# Patient Record
Sex: Male | Born: 1975
Health system: Southern US, Community
[De-identification: ages and names within clinical notes are randomized; demographics above are authoritative.]

## PROBLEM LIST (undated history)

## (undated) DIAGNOSIS — J449 Chronic obstructive pulmonary disease, unspecified: Secondary | ICD-10-CM

## (undated) DIAGNOSIS — E785 Hyperlipidemia, unspecified: Secondary | ICD-10-CM

## (undated) DIAGNOSIS — J9819 Other pulmonary collapse: Secondary | ICD-10-CM

## (undated) HISTORY — PX: SPINE SURGERY: SHX786

## (undated) HISTORY — PX: VASECTOMY: SHX75

---

## 2000-11-04 ENCOUNTER — Emergency Department (HOSPITAL_COMMUNITY): Admission: EM | Admit: 2000-11-04 | Discharge: 2000-11-04 | Payer: Self-pay | Admitting: Emergency Medicine

## 2005-03-15 ENCOUNTER — Inpatient Hospital Stay (HOSPITAL_COMMUNITY): Admission: EM | Admit: 2005-03-15 | Discharge: 2005-03-17 | Payer: Self-pay | Admitting: Emergency Medicine

## 2005-03-22 ENCOUNTER — Ambulatory Visit (HOSPITAL_COMMUNITY): Admission: RE | Admit: 2005-03-22 | Discharge: 2005-03-22 | Payer: Self-pay | Admitting: General Surgery

## 2007-05-08 ENCOUNTER — Ambulatory Visit (HOSPITAL_COMMUNITY): Admission: RE | Admit: 2007-05-08 | Discharge: 2007-05-08 | Payer: Self-pay | Admitting: Family Medicine

## 2007-05-14 ENCOUNTER — Ambulatory Visit (HOSPITAL_COMMUNITY): Admission: RE | Admit: 2007-05-14 | Discharge: 2007-05-14 | Payer: Self-pay | Admitting: Family Medicine

## 2007-05-14 ENCOUNTER — Encounter: Payer: Self-pay | Admitting: Orthopedic Surgery

## 2007-06-04 ENCOUNTER — Ambulatory Visit: Payer: Self-pay | Admitting: Orthopedic Surgery

## 2007-06-04 ENCOUNTER — Encounter (INDEPENDENT_AMBULATORY_CARE_PROVIDER_SITE_OTHER): Payer: Self-pay | Admitting: *Deleted

## 2007-06-04 DIAGNOSIS — G5690 Unspecified mononeuropathy of unspecified upper limb: Secondary | ICD-10-CM | POA: Insufficient documentation

## 2007-06-17 ENCOUNTER — Encounter: Payer: Self-pay | Admitting: Orthopedic Surgery

## 2007-06-18 ENCOUNTER — Encounter: Payer: Self-pay | Admitting: Orthopedic Surgery

## 2007-06-19 ENCOUNTER — Encounter: Payer: Self-pay | Admitting: Orthopedic Surgery

## 2007-06-26 ENCOUNTER — Ambulatory Visit: Payer: Self-pay | Admitting: Orthopedic Surgery

## 2007-07-15 ENCOUNTER — Encounter: Payer: Self-pay | Admitting: Orthopedic Surgery

## 2007-07-17 ENCOUNTER — Ambulatory Visit (HOSPITAL_COMMUNITY): Admission: RE | Admit: 2007-07-17 | Discharge: 2007-07-17 | Payer: Self-pay | Admitting: Neurology

## 2007-10-22 ENCOUNTER — Emergency Department (HOSPITAL_COMMUNITY): Admission: EM | Admit: 2007-10-22 | Discharge: 2007-10-22 | Payer: Self-pay | Admitting: Emergency Medicine

## 2007-12-02 ENCOUNTER — Encounter: Payer: Self-pay | Admitting: Orthopedic Surgery

## 2008-03-11 ENCOUNTER — Emergency Department (HOSPITAL_COMMUNITY): Admission: EM | Admit: 2008-03-11 | Discharge: 2008-03-11 | Payer: Self-pay | Admitting: Emergency Medicine

## 2008-04-25 ENCOUNTER — Emergency Department (HOSPITAL_COMMUNITY): Admission: EM | Admit: 2008-04-25 | Discharge: 2008-04-25 | Payer: Self-pay | Admitting: Emergency Medicine

## 2008-04-30 ENCOUNTER — Encounter: Payer: Self-pay | Admitting: Orthopedic Surgery

## 2008-08-18 ENCOUNTER — Emergency Department (HOSPITAL_COMMUNITY): Admission: EM | Admit: 2008-08-18 | Discharge: 2008-08-19 | Payer: Self-pay | Admitting: Emergency Medicine

## 2008-08-19 ENCOUNTER — Ambulatory Visit (HOSPITAL_COMMUNITY): Admission: RE | Admit: 2008-08-19 | Discharge: 2008-08-19 | Payer: Self-pay | Admitting: Emergency Medicine

## 2008-10-13 ENCOUNTER — Emergency Department (HOSPITAL_COMMUNITY): Admission: EM | Admit: 2008-10-13 | Discharge: 2008-10-13 | Payer: Self-pay | Admitting: Emergency Medicine

## 2008-10-15 ENCOUNTER — Emergency Department (HOSPITAL_COMMUNITY): Admission: EM | Admit: 2008-10-15 | Discharge: 2008-10-15 | Payer: Self-pay | Admitting: Emergency Medicine

## 2008-10-20 ENCOUNTER — Ambulatory Visit (HOSPITAL_COMMUNITY): Admission: RE | Admit: 2008-10-20 | Discharge: 2008-10-20 | Payer: Self-pay | Admitting: Orthopedic Surgery

## 2008-12-08 ENCOUNTER — Ambulatory Visit: Payer: Self-pay | Admitting: Orthopedic Surgery

## 2008-12-08 DIAGNOSIS — M545 Low back pain: Secondary | ICD-10-CM | POA: Insufficient documentation

## 2008-12-08 DIAGNOSIS — M5126 Other intervertebral disc displacement, lumbar region: Secondary | ICD-10-CM

## 2008-12-08 DIAGNOSIS — M5430 Sciatica, unspecified side: Secondary | ICD-10-CM | POA: Insufficient documentation

## 2008-12-15 ENCOUNTER — Encounter (HOSPITAL_COMMUNITY): Admission: RE | Admit: 2008-12-15 | Discharge: 2008-12-29 | Payer: Self-pay | Admitting: Orthopedic Surgery

## 2008-12-15 ENCOUNTER — Encounter: Payer: Self-pay | Admitting: Orthopedic Surgery

## 2008-12-31 ENCOUNTER — Encounter (HOSPITAL_COMMUNITY): Admission: RE | Admit: 2008-12-31 | Discharge: 2009-01-30 | Payer: Self-pay | Admitting: Orthopedic Surgery

## 2009-01-12 ENCOUNTER — Emergency Department (HOSPITAL_COMMUNITY): Admission: EM | Admit: 2009-01-12 | Discharge: 2009-01-12 | Payer: Self-pay | Admitting: Emergency Medicine

## 2009-01-21 ENCOUNTER — Encounter: Payer: Self-pay | Admitting: Orthopedic Surgery

## 2009-02-02 ENCOUNTER — Encounter (INDEPENDENT_AMBULATORY_CARE_PROVIDER_SITE_OTHER): Payer: Self-pay | Admitting: *Deleted

## 2009-02-02 ENCOUNTER — Ambulatory Visit: Payer: Self-pay | Admitting: Orthopedic Surgery

## 2009-02-15 ENCOUNTER — Encounter: Payer: Self-pay | Admitting: Orthopedic Surgery

## 2009-02-22 ENCOUNTER — Encounter: Payer: Self-pay | Admitting: Orthopedic Surgery

## 2009-02-23 ENCOUNTER — Telehealth (INDEPENDENT_AMBULATORY_CARE_PROVIDER_SITE_OTHER): Payer: Self-pay | Admitting: *Deleted

## 2009-03-17 ENCOUNTER — Encounter: Payer: Self-pay | Admitting: Orthopedic Surgery

## 2009-04-05 ENCOUNTER — Encounter (HOSPITAL_COMMUNITY): Admission: RE | Admit: 2009-04-05 | Discharge: 2009-05-05 | Payer: Self-pay | Admitting: Orthopedic Surgery

## 2009-05-24 ENCOUNTER — Ambulatory Visit (HOSPITAL_COMMUNITY): Admission: RE | Admit: 2009-05-24 | Discharge: 2009-05-25 | Payer: Self-pay | Admitting: Neurosurgery

## 2009-06-03 ENCOUNTER — Emergency Department (HOSPITAL_COMMUNITY): Admission: EM | Admit: 2009-06-03 | Discharge: 2009-06-03 | Payer: Self-pay | Admitting: Emergency Medicine

## 2009-06-16 ENCOUNTER — Encounter: Payer: Self-pay | Admitting: Orthopedic Surgery

## 2009-07-01 ENCOUNTER — Encounter (HOSPITAL_COMMUNITY): Admission: RE | Admit: 2009-07-01 | Discharge: 2009-07-31 | Payer: Self-pay | Admitting: Neurosurgery

## 2009-07-10 ENCOUNTER — Emergency Department (HOSPITAL_COMMUNITY): Admission: EM | Admit: 2009-07-10 | Discharge: 2009-07-10 | Payer: Self-pay | Admitting: Emergency Medicine

## 2009-07-21 ENCOUNTER — Encounter: Payer: Self-pay | Admitting: Orthopedic Surgery

## 2009-07-22 ENCOUNTER — Emergency Department (HOSPITAL_COMMUNITY): Admission: EM | Admit: 2009-07-22 | Discharge: 2009-07-22 | Payer: Self-pay | Admitting: Emergency Medicine

## 2009-11-27 ENCOUNTER — Emergency Department (HOSPITAL_COMMUNITY): Admission: EM | Admit: 2009-11-27 | Discharge: 2009-11-28 | Payer: Self-pay | Admitting: Emergency Medicine

## 2010-02-13 ENCOUNTER — Emergency Department (HOSPITAL_COMMUNITY): Admission: EM | Admit: 2010-02-13 | Discharge: 2010-02-13 | Payer: Self-pay | Admitting: Emergency Medicine

## 2010-05-02 NOTE — Letter (Signed)
Summary: [facility] Office note Dr Anise Salvo Office note Dr Jeral Fruit   Imported By: Cammie Sickle 07/23/2009 11:27:02  _____________________________________________________________________  External Attachment:    Type:   Image     Comment:   External Document

## 2010-05-02 NOTE — Letter (Signed)
Summary: [facility] office note Dr Anise Salvo office note Dr Jeral Fruit   Imported By: Cammie Sickle 07/07/2009 19:01:09  _____________________________________________________________________  External Attachment:    Type:   Image     Comment:   External Document

## 2010-05-02 NOTE — Consult Note (Signed)
Summary: Consultation Rpt [facility] Dr Jeral Fruit  Consultation Rpt Dr Jeral Fruit   Imported By: Cammie Sickle 04/19/2009 10:45:07  _____________________________________________________________________  External Attachment:    Type:   Image     Comment:   External Document

## 2010-05-02 NOTE — Letter (Signed)
Summary: Medical record request [facility] record request [facility]   Imported By: Cammie Sickle 07/23/2009 11:37:19  _____________________________________________________________________  External Attachment:    Type:   Image     Comment:   External Document

## 2010-06-16 LAB — BASIC METABOLIC PANEL
BUN: 17 mg/dL (ref 6–23)
CO2: 24 mEq/L (ref 19–32)
Calcium: 9.4 mg/dL (ref 8.4–10.5)
Chloride: 105 mEq/L (ref 96–112)
Creatinine, Ser: 0.9 mg/dL (ref 0.4–1.5)
GFR calc Af Amer: >60 mL/min (ref >60)
GFR calc non Af Amer: >60 mL/min (ref >60)
Glucose, Bld: 98 mg/dL (ref 70–99)
Potassium: 3.8 mEq/L (ref 3.5–5.1)
Sodium: 139 mEq/L (ref 135–145)

## 2010-06-16 LAB — CBC
HCT: 46.6 % (ref 39.0–52.0)
Hemoglobin: 15.2 g/dL (ref 13.0–17.0)
MCH: 27.3 pg (ref 26.0–34.0)
MCHC: 32.7 g/dL (ref 30.0–36.0)
MCV: 83.5 fL (ref 78.0–100.0)
Platelets: 178 10*3/uL (ref 150–400)
RBC: 5.58 MIL/uL (ref 4.22–5.81)
RDW: 14.3 % (ref 11.5–15.5)
WBC: 11 10*3/uL — ABNORMAL HIGH (ref 4.0–10.5)

## 2010-06-16 LAB — POCT CARDIAC MARKERS
CKMB, poc: <1 ng/mL — ABNORMAL LOW (ref 1.0–8.0)
CKMB, poc: <1 ng/mL — ABNORMAL LOW (ref 1.0–8.0)
Myoglobin, poc: 35 ng/mL (ref 12–200)
Troponin i, poc: <0.05 ng/mL (ref 0.00–0.09)
Troponin i, poc: <0.05 ng/mL (ref 0.00–0.09)

## 2010-06-16 LAB — DIFFERENTIAL
Basophils Absolute: 0 10*3/uL (ref 0.0–0.1)
Basophils Relative: 0 % (ref 0–1)
Eosinophils Absolute: 0.2 10*3/uL (ref 0.0–0.7)
Eosinophils Relative: 2 % (ref 0–5)
Lymphocytes Relative: 19 % (ref 12–46)
Lymphs Abs: 2.1 10*3/uL (ref 0.7–4.0)
Monocytes Absolute: 0.8 10*3/uL (ref 0.1–1.0)
Monocytes Relative: 7 % (ref 3–12)
Neutro Abs: 8 10*3/uL — ABNORMAL HIGH (ref 1.7–7.7)
Neutrophils Relative %: 72 % (ref 43–77)

## 2010-06-21 LAB — RAPID URINE DRUG SCREEN, HOSP PERFORMED
Benzodiazepines: POSITIVE — AB
Cocaine: NOT DETECTED
Opiates: POSITIVE — AB
Tetrahydrocannabinol: NOT DETECTED

## 2010-06-23 LAB — CBC
HCT: 45.1 % (ref 39.0–52.0)
MCV: 83.2 fL (ref 78.0–100.0)
Platelets: 145 10*3/uL — ABNORMAL LOW (ref 150–400)
RDW: 15.3 % (ref 11.5–15.5)

## 2010-08-18 NOTE — H&P (Signed)
NAMEELSTER, CORBELLO NO.:  [id number]   MEDICAL RECORD NO.:  [id number]          PATIENT TYPE:  EMS   LOCATION:  ED                            FACILITY:  [facility]   PHYSICIAN:  Dalia Heading, M.D.  DATE OF BIRTH:  [birth year]   DATE OF ADMISSION:  03/15/2005  DATE OF DISCHARGE:  LH                                HISTORY & PHYSICAL   CHIEF COMPLAINT:  Right pneumothorax.   HISTORY OF PRESENT ILLNESS:  The patient is a 35 year old white male with  history of tobacco use who presented to the emergency room with a five to  six day history of right sided chest pain.  Chest x-ray was performed which  revealed a right pneumothorax which was approximately 50% in nature.  A  surgery consultation was obtained.   PAST MEDICAL HISTORY:  Unremarkable.   PAST SURGICAL HISTORY:  Unremarkable.   CURRENT MEDICATIONS:  None.   ALLERGIES:  PENICILLIN.   REVIEW OF SYSTEMS:  Noncontributory.   PHYSICAL EXAMINATION:  GENERAL APPEARANCE:  The patient is a well-developed,  well-nourished white male in no acute distress.  He is afebrile and vital  signs are stable.  VITAL SIGNS:  Oxygen saturation about 96%.  LUNGS:  Examination reveals decreased breath sounds in the right lung field.  HEART:  Examination reveals a regular rate and rhythm without S3, S4, or  murmurs.   DESCRIPTION OF PROCEDURE:  The patient was placed in the left lateral  decubitus position.  A small caliber chest tube was placed in the right  chest using 1% Xylocaine local anesthesia without difficulty.  It was  connected to the Thorotrast suction kit.  Initially, air was noted to  emanate through the system but this stopped.  A follow up chest x-ray is  pending.  The patient tolerated the procedure well.   IMPRESSION:  Right spontaneous pneumothorax.   PLAN:  The patient will be admitted to the hospital for management of his  chest tube.  He will approximately be in the hospital three days while his  right  lung is healing.      Dalia Heading, M.D.  Electronically Signed     MAJ/MEDQ  D:  03/15/2005  T:  03/15/2005  Job:  [id number]   cc:   Patrica Duel, M.D.  Fax: [phone]

## 2010-08-18 NOTE — Discharge Summary (Signed)
NAME:  ALONTE, WULFF NO.:  [id number]   MEDICAL RECORD NO.:  [id number]          PATIENT TYPE:  INP   LOCATION:  A323                          FACILITY:  [facility]   PHYSICIAN:  Dalia Heading, M.D.  DATE OF BIRTH:  1975/06/08   DATE OF ADMISSION:  03/15/2005  DATE OF DISCHARGE:  12/16/2006LH                                 DISCHARGE SUMMARY   HOSPITAL COURSE:  The patient is a 35 year old, white male who presented to  the emergency room with right-sided chest pain and shortness of breath.  He  was found to have a spontaneous right pneumothorax.  A chest tube was  inserted and the patient was admitted to hospital for further management of  his chest tube.  No air leak was present.  On hospital day #2, his chest  tubes placed to water seal and there was no evidence of a pneumothorax.  The  chest tube was removed and chest x-ray is pending.   The patient is being discharged home in good and improving condition.   DISCHARGE INSTRUCTIONS:  The patient is to follow up Dr. Franky Macho on  March 22, 2005 for a chest x-ray.   DISCHARGE MEDICATIONS:  None.   DISCHARGE DIAGNOSIS:  Spontaneous right pneumothorax.   PROCEDURE:  Closed thoracostomy on March 15, 2005.      Dalia Heading, M.D.  Electronically Signed     MAJ/MEDQ  D:  03/17/2005  T:  03/17/2005  Job:  [id number]   cc:   Patrica Duel, M.D.  Fax: [phone]

## 2010-10-20 ENCOUNTER — Emergency Department (HOSPITAL_COMMUNITY): Payer: Medicare Other

## 2010-10-20 ENCOUNTER — Emergency Department (HOSPITAL_COMMUNITY)
Admission: EM | Admit: 2010-10-20 | Discharge: 2010-10-20 | Disposition: A | Discharge status: Home / Self Care | Payer: Medicare Other | Attending: Emergency Medicine | Admitting: Emergency Medicine

## 2010-10-20 DIAGNOSIS — R319 Hematuria, unspecified: Secondary | ICD-10-CM | POA: Insufficient documentation

## 2010-10-20 DIAGNOSIS — M549 Dorsalgia, unspecified: Secondary | ICD-10-CM

## 2010-10-20 DIAGNOSIS — R109 Unspecified abdominal pain: Secondary | ICD-10-CM | POA: Insufficient documentation

## 2010-10-20 DIAGNOSIS — F17200 Nicotine dependence, unspecified, uncomplicated: Secondary | ICD-10-CM | POA: Insufficient documentation

## 2010-10-20 LAB — URINALYSIS, ROUTINE W REFLEX MICROSCOPIC
Bilirubin Urine: NEGATIVE
Hgb urine dipstick: NEGATIVE
Specific Gravity, Urine: 1.025 (ref 1.005–1.030)
Urobilinogen, UA: 0.2 mg/dL (ref 0.0–1.0)

## 2010-10-20 LAB — DIFFERENTIAL
Basophils Absolute: 0 10*3/uL (ref 0.0–0.1)
Eosinophils Absolute: 0.2 10*3/uL (ref 0.0–0.7)
Lymphs Abs: 2.5 10*3/uL (ref 0.7–4.0)
Neutro Abs: 2.8 10*3/uL (ref 1.7–7.7)
Neutrophils Relative %: 43 % (ref 43–77)

## 2010-10-20 LAB — BASIC METABOLIC PANEL
BUN: 17 mg/dL (ref 6–23)
Calcium: 9.2 mg/dL (ref 8.4–10.5)
GFR calc non Af Amer: >60 mL/min (ref >60)
Glucose, Bld: 79 mg/dL (ref 70–99)

## 2010-10-20 LAB — CBC
MCH: 27.4 pg (ref 26.0–34.0)
MCHC: 33.4 g/dL (ref 30.0–36.0)
Platelets: 161 10*3/uL (ref 150–400)
RDW: 14.5 % (ref 11.5–15.5)

## 2010-10-20 MED ORDER — ONDANSETRON HCL 4 MG PO TABS: 4.0000 mg | ORAL_TABLET | Freq: Three times a day (TID) | ORAL | Status: AC | PRN

## 2010-10-20 MED ORDER — HYDROCODONE-ACETAMINOPHEN 5-325 MG PO TABS: 1.0000 | ORAL_TABLET | Freq: Four times a day (QID) | ORAL | Status: AC | PRN

## 2010-10-20 MED ORDER — ONDANSETRON HCL 4 MG/2ML IJ SOLN
4.0000 mg | Freq: Once | INTRAMUSCULAR | Status: AC
  Administered 2010-10-20: 4 mg via INTRAVENOUS
  Filled 2010-10-20: qty 2

## 2010-10-20 MED ORDER — HYDROMORPHONE HCL 1 MG/ML IJ SOLN
1.0000 mg | Freq: Once | INTRAMUSCULAR | Status: AC
  Administered 2010-10-20: 1 mg via INTRAVENOUS
  Filled 2010-10-20: qty 1

## 2010-10-20 MED ORDER — KETOROLAC TROMETHAMINE 30 MG/ML IJ SOLN
30.0000 mg | Freq: Once | INTRAMUSCULAR | Status: AC
  Administered 2010-10-20: 30 mg via INTRAVENOUS
  Filled 2010-10-20: qty 1

## 2010-10-20 MED ORDER — SODIUM CHLORIDE 0.9 % IV SOLN
INTRAVENOUS | Status: DC
  Administered 2010-10-20: 13:00:00 via INTRAVENOUS

## 2010-10-20 NOTE — ED Notes (Signed)
Pt presents with left flank pain and hematuria. Pt states symptoms started last night. Pt denies n/v/d.

## 2010-10-20 NOTE — ED Notes (Signed)
edp notified pt c/o pain.  Toradol ordered and administered.  PT requesting water to drink.  EDP says not at this time.  Explained to pt.

## 2010-10-20 NOTE — ED Provider Notes (Signed)
History     Chief Complaint  Patient presents with  . Flank Pain  . Hematuria   Patient is a 35 y.o. male presenting with flank pain and hematuria. The history is provided by the patient. No language interpreter was used.  Flank Pain This is a new problem. The current episode started yesterday. Episode frequency: intermittently. The problem has been gradually worsening. Pertinent negatives include no chest pain, no abdominal pain, no headaches and no shortness of breath. Exacerbated by: palpation, breathing. The symptoms are relieved by nothing. He has tried nothing for the symptoms.  Hematuria This is a new problem. The current episode started today. The problem is unchanged. He describes the hematuria as gross hematuria. The hematuria occurs during the initial portion of his urinary stream. He reports no clotting in his urine stream. Urine color: "blood red" Irritative symptoms do not include frequency. Associated symptoms include flank pain. Pertinent negatives include no abdominal pain, fever, nausea or vomiting.  C/o non-radiating sharp left sided flank pain onset last night with associated new onset of hematuria this morning and persistent since. States hematuria started this morning and was initially a pink color and progressed to a blood red color as he continued to urinate. Patient reports flank pain is aggravated with palpation and breathing. Denies vomiting, fever, sore throat, nausea, cough and history of similar symptoms.  Patient seen at 12:50 PM   History reviewed. No pertinent past medical history.  Past Surgical History  Procedure Date  . Spine surgery     History reviewed. No pertinent family history.  History  Substance Use Topics  . Smoking status: Current Everyday Smoker -- 1.5 packs/day    Types: Cigarettes  . Smokeless tobacco: Not on file  . Alcohol Use: Yes      Review of Systems  Constitutional: Negative for fever and fatigue.  HENT: Negative for  congestion, sinus pressure and ear discharge.   Eyes: Negative for discharge.  Respiratory: Negative for cough and shortness of breath.   Cardiovascular: Negative for chest pain.  Gastrointestinal: Negative for nausea, vomiting, abdominal pain and diarrhea.  Genitourinary: Positive for hematuria and flank pain. Negative for frequency.  Musculoskeletal: Negative for back pain.  Skin: Negative for rash.  Neurological: Negative for seizures and headaches.  Hematological: Negative.   Psychiatric/Behavioral: Negative for hallucinations.    Physical Exam  BP 119/57  Pulse 60  Temp(Src) 97.5 F (36.4 C) (Oral)  Resp 20  Ht 6\' 3"  (1.905 m)  Wt 185 lb (83.915 kg)  BMI 23.12 kg/m2  SpO2 100%  Physical Exam  Nursing note and vitals reviewed. Constitutional: He is oriented to person, place, and time. Vital signs are normal. He appears well-developed. No distress.  HENT:  Head: Normocephalic and atraumatic.  Eyes: Conjunctivae are normal. No scleral icterus.  Neck: Neck supple. No thyromegaly present.  Cardiovascular: Normal rate and regular rhythm.  Exam reveals no gallop and no friction rub.   No murmur heard. Pulmonary/Chest: No stridor. He has no wheezes. He has no rales. He exhibits no tenderness.  Abdominal: Soft. Bowel sounds are normal. He exhibits no distension. There is no tenderness. There is no rebound.       Left flank tender to palpation.   Musculoskeletal: Normal range of motion. He exhibits no edema.  Lymphadenopathy:    He has no cervical adenopathy.  Neurological: He is oriented to person, place, and time. Coordination normal.  Skin: No rash noted. No erythema.  Psychiatric: He has a normal mood  and affect. His behavior is normal.    ED Course  Procedures  MDM Results discussed Ct Abdomen Pelvis Wo Contrast  10/20/2010  *RADIOLOGY REPORT*  Clinical Data: Left sided flank pain and hematuria  CT ABDOMEN AND PELVIS WITHOUT CONTRAST  Technique:  Multidetector CT  imaging of the abdomen and pelvis was performed following the standard protocol without intravenous contrast.  Comparison: None.  Findings:  Evaluation of the abdominal organs is limited secondary to lack of intravenous contrast.  Normal noncontrast appearance of the bilateral kidneys.  No radiopaque renal stones.  No evidence of urinary obstruction.  No urinary stones.  Several phleboliths are incidentally noted within the pelvis. Scattered calcifications within the prostate.  Normal noncontrast appearance of the bilateral adrenal glands.  Normal hepatic contour.  No discrete hyper or hypo attenuating hepatic lesions.  No perihepatic fluid.  Normal noncontrast appearance of the gallbladder, pancreas and spleen.  Incidental note is made of several small splenules.  Colonic diverticulosis without CT evidence of diverticulitis. The bowel is otherwise normal in course and caliber without wall thickening or evidence of obstruction.  Normal appendix.  No pneumoperitoneum, pneumatosis or portal venous gas.  Normal caliber of the abdominal aorta.  Scattered shoddy retroperitoneal lymph nodes are not enlarged by CT criteria.  No retroperitoneal, mesenteric, pelvic or inguinal lymphadenopathy.   Limited visualization of the lower thorax demonstrates bilateral dependent ground-glass atelectasis.  No focal airspace opacities. Normal heart size.  No pericardial effusion.  Minimal (2-3 mm) of retrolisthesis of L5 upon S1, with associated DDD.    No acute or progressive osseous abnormalities.  IMPRESSION:     1.    No explanation for patient's left sided flank pain.       Specifically, no renal stones or evidence of urinary obstruction.        2.    Diverticulosis without CT evidence of acute diverticulitis on this noncontrast examination. Original Report Authenticated By: [id number]   Results for orders placed during the hospital encounter of 10/20/10  BASIC METABOLIC PANEL      Component Value Range   Sodium 140  135 - 145  (mEq/L)   Potassium 4.1  3.5 - 5.1 (mEq/L)   Chloride 105  96 - 112 (mEq/L)   CO2 29  19 - 32 (mEq/L)   Glucose, Bld 79  70 - 99 (mg/dL)   BUN 17  6 - 23 (mg/dL)   Creatinine, Ser 9.14  0.50 - 1.35 (mg/dL)   Calcium 9.2  8.4 - 78.2 (mg/dL)   GFR calc non Af Amer >60  >60 (mL/min)   GFR calc Af Amer >60  >60 (mL/min)  CBC      Component Value Range   WBC 6.0  4.0 - 10.5 (K/uL)   RBC 5.30  4.22 - 5.81 (MIL/uL)   Hemoglobin 14.5  13.0 - 17.0 (g/dL)   HCT 95.6  21.3 - 08.6 (%)   MCV 81.9  78.0 - 100.0 (fL)   MCH 27.4  26.0 - 34.0 (pg)   MCHC 33.4  30.0 - 36.0 (g/dL)   RDW 57.8  46.9 - 62.9 (%)   Platelets 161  150 - 400 (K/uL)  DIFFERENTIAL      Component Value Range   Neutrophils Relative PENDING  43 - 77 (%)   Neutro Abs PENDING  1.7 - 7.7 (K/uL)   Band Neutrophils PENDING  0 - 10 (%)   Lymphocytes Relative PENDING  12 - 46 (%)   Lymphs Abs PENDING  0.7 - 4.0 (K/uL)   Monocytes Relative PENDING  3 - 12 (%)   Monocytes Absolute PENDING  0.1 - 1.0 (K/uL)   Eosinophils Relative PENDING  0 - 5 (%)   Eosinophils Absolute PENDING  0.0 - 0.7 (K/uL)   Basophils Relative PENDING  0 - 1 (%)   Basophils Absolute PENDING  0.0 - 0.1 (K/uL)   WBC Morphology PENDING     RBC Morphology PENDING     Smear Review PENDING     nRBC PENDING  0 (/100 WBC)   Metamyelocytes Relative PENDING     Myelocytes PENDING     Promyelocytes Absolute PENDING     Blasts PENDING    URINALYSIS, ROUTINE W REFLEX MICROSCOPIC      Component Value Range   Color, Urine YELLOW  YELLOW    Appearance CLEAR  CLEAR    Specific Gravity, Urine 1.025  1.005 - 1.030    pH 6.0  5.0 - 8.0    Glucose, UA NEGATIVE  NEGATIVE (mg/dL)   Hgb urine dipstick NEGATIVE  NEGATIVE    Bilirubin Urine NEGATIVE  NEGATIVE    Ketones, ur NEGATIVE  NEGATIVE (mg/dL)   Protein, ur NEGATIVE  NEGATIVE (mg/dL)   Urobilinogen, UA 0.2  0.0 - 1.0 (mg/dL)   Nitrite NEGATIVE  NEGATIVE    Leukocytes, UA NEGATIVE  NEGATIVE       Chart written  by Clarita Crane acting as scribe for Benny Lennert, MD  I personally performed the services described in this documentation, which was scribed in my presence. The recorded information has been reviewed and considered.   Benny Lennert, MD 10/20/10 1626

## 2010-10-20 NOTE — ED Notes (Signed)
Patient is resting comfortably. 

## 2010-12-29 LAB — URINALYSIS, ROUTINE W REFLEX MICROSCOPIC
Glucose, UA: NEGATIVE
Hgb urine dipstick: NEGATIVE
Protein, ur: NEGATIVE

## 2011-03-03 ENCOUNTER — Emergency Department (HOSPITAL_COMMUNITY): Payer: Medicare Other

## 2011-03-03 ENCOUNTER — Emergency Department (HOSPITAL_COMMUNITY)
Admission: EM | Admit: 2011-03-03 | Discharge: 2011-03-03 | Disposition: A | Discharge status: Home / Self Care | Payer: Medicare Other | Attending: Emergency Medicine | Admitting: Emergency Medicine

## 2011-03-03 ENCOUNTER — Encounter (HOSPITAL_COMMUNITY): Payer: Self-pay | Admitting: *Deleted

## 2011-03-03 DIAGNOSIS — F17200 Nicotine dependence, unspecified, uncomplicated: Secondary | ICD-10-CM | POA: Insufficient documentation

## 2011-03-03 DIAGNOSIS — E785 Hyperlipidemia, unspecified: Secondary | ICD-10-CM | POA: Insufficient documentation

## 2011-03-03 DIAGNOSIS — R1031 Right lower quadrant pain: Secondary | ICD-10-CM | POA: Insufficient documentation

## 2011-03-03 DIAGNOSIS — M25569 Pain in unspecified knee: Secondary | ICD-10-CM | POA: Insufficient documentation

## 2011-03-03 HISTORY — DX: Hyperlipidemia, unspecified: E78.5

## 2011-03-03 LAB — URINALYSIS, ROUTINE W REFLEX MICROSCOPIC
Bilirubin Urine: NEGATIVE
Ketones, ur: NEGATIVE mg/dL
Nitrite: NEGATIVE
Specific Gravity, Urine: 1.025 (ref 1.005–1.030)
Urobilinogen, UA: 0.2 mg/dL (ref 0.0–1.0)

## 2011-03-03 LAB — CBC
HCT: 45.7 % (ref 39.0–52.0)
Hemoglobin: 15 g/dL (ref 13.0–17.0)
MCV: 83.2 fL (ref 78.0–100.0)
RBC: 5.49 MIL/uL (ref 4.22–5.81)
RDW: 14.5 % (ref 11.5–15.5)
WBC: 9 10*3/uL (ref 4.0–10.5)

## 2011-03-03 LAB — DIFFERENTIAL
Eosinophils Relative: 3 % (ref 0–5)
Lymphocytes Relative: 27 % (ref 12–46)
Lymphs Abs: 2.4 10*3/uL (ref 0.7–4.0)
Monocytes Absolute: 1 10*3/uL (ref 0.1–1.0)
Monocytes Relative: 11 % (ref 3–12)

## 2011-03-03 LAB — COMPREHENSIVE METABOLIC PANEL
BUN: 20 mg/dL (ref 6–23)
CO2: 33 mEq/L — ABNORMAL HIGH (ref 19–32)
Calcium: 10 mg/dL (ref 8.4–10.5)
Creatinine, Ser: 0.88 mg/dL (ref 0.50–1.35)
GFR calc Af Amer: >90 mL/min (ref >90)
GFR calc non Af Amer: >90 mL/min (ref >90)
Glucose, Bld: 93 mg/dL (ref 70–99)

## 2011-03-03 MED ORDER — HYDROCODONE-ACETAMINOPHEN 5-325 MG PO TABS: 1.0000 | ORAL_TABLET | Freq: Four times a day (QID) | ORAL | Status: AC | PRN

## 2011-03-03 MED ORDER — SODIUM CHLORIDE 0.9 % IV SOLN
Freq: Once | INTRAVENOUS | Status: AC
  Administered 2011-03-03: 1000 mL via INTRAVENOUS

## 2011-03-03 MED ORDER — ONDANSETRON HCL 4 MG/2ML IJ SOLN
4.0000 mg | Freq: Once | INTRAMUSCULAR | Status: AC
  Administered 2011-03-03: 4 mg via INTRAVENOUS
  Filled 2011-03-03: qty 2

## 2011-03-03 MED ORDER — HYDROMORPHONE HCL PF 1 MG/ML IJ SOLN
1.0000 mg | Freq: Once | INTRAMUSCULAR | Status: AC
  Administered 2011-03-03: 1 mg via INTRAVENOUS
  Filled 2011-03-03: qty 1

## 2011-03-03 MED ORDER — IOHEXOL 300 MG/ML  SOLN
100.0000 mL | Freq: Once | INTRAMUSCULAR | Status: AC | PRN
  Administered 2011-03-03: 100 mL via INTRAVENOUS

## 2011-03-03 NOTE — ED Notes (Signed)
Pt c/o pain to right lower quadrant of abd. Pt states pain sharp and radiates to umbilical area. Pt states pain increases with cough and deep breath.

## 2011-03-03 NOTE — ED Provider Notes (Cosign Needed)
History     CSN: [id number] Arrival date & time: 03/03/2011 10:33 AM   First MD Initiated Contact with Patient 03/03/11 1117      Chief Complaint  Patient presents with  . Abdominal Pain  . Knee Pain    (Consider location/radiation/quality/duration/timing/severity/associated sxs/prior treatment) Patient is a 35 y.o. male presenting with abdominal pain and knee pain. The history is provided by the patient (Patient complains of right-sided lower abdominal pain worse with movement cough. Patient also has pain in his left knee. He has a history of left knee problems). No language interpreter was used.  Abdominal Pain The primary symptoms of the illness include abdominal pain. The primary symptoms of the illness do not include fever, fatigue or diarrhea. The current episode started 2 days ago. The onset of the illness was gradual. The problem has not changed since onset. The abdominal pain began 2 days ago. The abdominal pain has been unchanged since its onset. The abdominal pain radiates to the RLQ. The severity of the abdominal pain is 4/10. The abdominal pain is relieved by nothing. The abdominal pain is exacerbated by coughing.  Symptoms associated with the illness do not include hematuria, frequency or back pain.  Knee Pain Associated symptoms include abdominal pain. Pertinent negatives include no chest pain and no headaches.    Past Medical History  Diagnosis Date  . Hyperlipidemia     Past Surgical History  Procedure Date  . Spine surgery     History reviewed. No pertinent family history.  History  Substance Use Topics  . Smoking status: Current Everyday Smoker -- 0.5 packs/day    Types: Cigarettes  . Smokeless tobacco: Not on file  . Alcohol Use: Yes      Review of Systems  Constitutional: Negative for fever and fatigue.  HENT: Negative for congestion, sinus pressure and ear discharge.   Eyes: Negative for discharge.  Respiratory: Negative for cough.     Cardiovascular: Negative for chest pain.  Gastrointestinal: Positive for abdominal pain. Negative for diarrhea.  Genitourinary: Negative for frequency and hematuria.  Musculoskeletal: Negative for back pain.       Mild tendernous left knee.  From.  neurovasc normal  Skin: Negative for rash.  Neurological: Negative for seizures and headaches.  Hematological: Negative.   Psychiatric/Behavioral: Negative for hallucinations.    Allergies  Penicillins  Home Medications   Current Outpatient Rx  Name Route Sig Dispense Refill  . ALPRAZOLAM 1 MG PO TABS Oral Take 1 mg by mouth 4 (four) times daily as needed. For anxiety    . GOODYS EXTRA STRENGTH PO Oral Take 1 packet by mouth every 8 (eight) hours as needed. For pain     . ESCITALOPRAM OXALATE 20 MG PO TABS Oral Take 10 mg by mouth 2 (two) times daily.      . IBUPROFEN 200 MG PO TABS Oral Take 600 mg by mouth every 6 (six) hours as needed. For pain     . LAMOTRIGINE 100 MG PO TABS Oral Take 100 mg by mouth daily.      . QUETIAPINE FUMARATE 50 MG PO TABS Oral Take 50 mg by mouth at bedtime.      Marland Kitchen RISPERIDONE 1 MG PO TABS Oral Take 1 mg by mouth 2 (two) times daily.      Marland Kitchen HYDROCODONE-ACETAMINOPHEN 5-325 MG PO TABS Oral Take 1 tablet by mouth every 6 (six) hours as needed for pain. 20 tablet 0  . NAPROXEN 500 MG PO TABS Oral Take  500 mg by mouth 2 (two) times daily with a meal.        BP 114/63  Pulse 62  Temp(Src) 97.9 F (36.6 C) (Oral)  Resp 20  Ht 6\' 3"  (1.905 m)  Wt 200 lb (90.719 kg)  BMI 25.00 kg/m2  SpO2 98%  Physical Exam  ED Course  Procedures (including critical care time)  Labs Reviewed  COMPREHENSIVE METABOLIC PANEL - Abnormal; Notable for the following:    CO2 33 (*)    All other components within normal limits  CBC  DIFFERENTIAL  LIPASE, BLOOD  URINALYSIS, ROUTINE W REFLEX MICROSCOPIC   Dg Chest 2 View  03/03/2011  *RADIOLOGY REPORT*  Clinical Data: Right lower quadrant pain, cough  CHEST - 2 VIEW   Comparison:  11/27/2009  Findings:  The heart size and mediastinal contours are within normal limits.  Both lungs are clear.  The visualized skeletal structures are unremarkable.  IMPRESSION: No active cardiopulmonary disease.  Original Report Authenticated By: Judie Petit. Ruel Favors, M.D.   Ct Abdomen Pelvis W Contrast  03/03/2011  *RADIOLOGY REPORT*  Clinical Data: Right lower quadrant abdominal pain.  CT ABDOMEN AND PELVIS WITH CONTRAST  Technique:  Multidetector CT imaging of the abdomen and pelvis was performed following the standard protocol during bolus administration of intravenous contrast.  Contrast: OMNIPAQUE IOHEXOL 300 MG/ML IV SOLN 100 ml Omnipaque-300.  Comparison: CT scan 10/20/2010.  Findings: The lung bases are clear except for minimal dependent atelectasis.  The solid abdominal organs are normal.  The gallbladder is normal. No common bile duct dilatation.  The stomach, duodenum, small bowel and colon are unremarkable.  No focal lesions or inflammatory changes.  The appendix is normal.  No mesenteric or retroperitoneal masses or lymphadenopathy.  The aorta is normal in caliber.  The major branch vessels are normal.  The portal and splenic veins are patent.  The bladder, prostate gland and seminal vesicles are unremarkable. No pelvic mass, adenopathy or free pelvic fluid collections.  No inguinal mass or hernia.  The bony structures are unremarkable.  IMPRESSION: No acute abdominal/pelvic findings, mass lesions or adenopathy.  Original Report Authenticated By: P. Loralie Champagne, M.D.   Dg Knee Complete 4 Views Left  03/03/2011  *RADIOLOGY REPORT*  Clinical Data: Pain, no injury.  LEFT KNEE - COMPLETE 4+ VIEW  Comparison:  None.  Findings:  There is no evidence of fracture, dislocation, or joint effusion.  There is no evidence of arthropathy or other focal bone abnormality.  Soft tissues are unremarkable.  IMPRESSION: Negative.  Original Report Authenticated By: Elsie Stain, M.D.     1.  Abdominal pain   2. Knee pain       MDM          Benny Lennert, MD 03/03/11 [phone]

## 2011-11-03 ENCOUNTER — Emergency Department (HOSPITAL_COMMUNITY): Payer: Medicare Other

## 2011-11-03 ENCOUNTER — Emergency Department (HOSPITAL_COMMUNITY)
Admission: EM | Admit: 2011-11-03 | Discharge: 2011-11-03 | Disposition: A | Discharge status: Home / Self Care | Payer: Medicare Other | Attending: Emergency Medicine | Admitting: Emergency Medicine

## 2011-11-03 ENCOUNTER — Encounter (HOSPITAL_COMMUNITY): Payer: Self-pay | Admitting: Emergency Medicine

## 2011-11-03 DIAGNOSIS — R079 Chest pain, unspecified: Secondary | ICD-10-CM

## 2011-11-03 DIAGNOSIS — E785 Hyperlipidemia, unspecified: Secondary | ICD-10-CM | POA: Insufficient documentation

## 2011-11-03 DIAGNOSIS — Z79899 Other long term (current) drug therapy: Secondary | ICD-10-CM | POA: Insufficient documentation

## 2011-11-03 DIAGNOSIS — F17200 Nicotine dependence, unspecified, uncomplicated: Secondary | ICD-10-CM | POA: Insufficient documentation

## 2011-11-03 DIAGNOSIS — Z880 Allergy status to penicillin: Secondary | ICD-10-CM | POA: Insufficient documentation

## 2011-11-03 HISTORY — DX: Other pulmonary collapse: J98.19

## 2011-11-03 LAB — D-DIMER, QUANTITATIVE: D-Dimer, Quant: <0.22 ug{FEU}/mL (ref 0.00–0.48)

## 2011-11-03 MED ORDER — OXYCODONE-ACETAMINOPHEN 5-325 MG PO TABS
1.0000 | ORAL_TABLET | Freq: Once | ORAL | Status: AC
  Administered 2011-11-03: 1 via ORAL
  Filled 2011-11-03: qty 1

## 2011-11-03 MED ORDER — OXYCODONE-ACETAMINOPHEN 5-325 MG PO TABS: 1.0000 | ORAL_TABLET | Freq: Four times a day (QID) | ORAL | Status: AC | PRN

## 2011-11-03 MED ORDER — ALBUTEROL SULFATE HFA 108 (90 BASE) MCG/ACT IN AERS
2.0000 | INHALATION_SPRAY | RESPIRATORY_TRACT | Status: DC | PRN
  Administered 2011-11-03: 2 via RESPIRATORY_TRACT
  Filled 2011-11-03: qty 6.7

## 2011-11-03 NOTE — ED Notes (Signed)
Patient c/o generalized chest pain for one week; states started coughing a few days and it's getting worse.  Patient c/o shortness of breath.  States nonproductive cough.  States some nausea.

## 2011-11-03 NOTE — ED Provider Notes (Signed)
History   This chart was scribed for [employer]. Rubin Payor, MD by Charolett Bumpers . The patient was seen in room APA18/APA18. Patient's care was started at 68.    CSN: [id number]  Arrival date & time 11/03/11  1949   First MD Initiated Contact with Patient 11/03/11 1953      Chief Complaint  Patient presents with  . Chest Pain    (Consider location/radiation/quality/duration/timing/severity/associated sxs/prior treatment) HPI Richard Hampton is a 36 y.o. male who presents to the Emergency Department complaining of constant, moderate generalized chest pain for the past week. Pt reports an associated non-productive cough, nausea and SOB. Pt describes chest pain as a sharp and squeezing pain. Pt states that his chest pain is aggravated with coughing. Pt denies any known sick contacts. Pt denies any h/o heart problems and states that he is otherwise healthy. Pt report smoking 1/2 pack a day. Pt denies any h/o IV drug use.   Past Medical History  Diagnosis Date  . Hyperlipidemia   . Collapsed lung     right side    Past Surgical History  Procedure Date  . Spine surgery     No family history on file.  History  Substance Use Topics  . Smoking status: Current Everyday Smoker -- 0.5 packs/day    Types: Cigarettes  . Smokeless tobacco: Not on file  . Alcohol Use: Yes     occ      Review of Systems  Respiratory: Positive for cough and shortness of breath.   Cardiovascular: Positive for chest pain.  Gastrointestinal: Positive for nausea. Negative for vomiting.  Skin: Negative for rash.  All other systems reviewed and are negative.    Allergies  Penicillins  Home Medications   Current Outpatient Rx  Name Route Sig Dispense Refill  . ALPRAZOLAM 1 MG PO TABS Oral Take 1 mg by mouth 4 (four) times daily as needed. For anxiety    . ESCITALOPRAM OXALATE 20 MG PO TABS Oral Take 20 mg by mouth daily.     Marland Kitchen NAPROXEN 500 MG PO TABS Oral Take 500 mg by mouth 2 (two)  times daily with a meal.      . QUETIAPINE FUMARATE 50 MG PO TABS Oral Take 50 mg by mouth at bedtime.      Marland Kitchen RISPERIDONE 1 MG PO TABS Oral Take 1 mg by mouth 2 (two) times daily.      . OXYCODONE-ACETAMINOPHEN 5-325 MG PO TABS Oral Take 1-2 tablets by mouth every 6 (six) hours as needed for pain. 10 tablet 0    BP 129/75  Pulse 99  Temp 98.5 F (36.9 C) (Oral)  Resp 20  Ht 6\' 3"  (1.905 m)  Wt 215 lb (97.523 kg)  BMI 26.87 kg/m2  SpO2 96%  Physical Exam  Nursing note and vitals reviewed. Constitutional: He is oriented to person, place, and time. He appears well-developed and well-nourished. No distress.  HENT:  Head: Normocephalic and atraumatic.  Eyes: EOM are normal. Pupils are equal, round, and reactive to light.  Neck: Neck supple. No tracheal deviation present.  Cardiovascular: Normal rate, regular rhythm and normal heart sounds.   Pulmonary/Chest: Effort normal and breath sounds normal. No respiratory distress. He has no wheezes. He has no rales. He exhibits tenderness.       Mild harsh breath sounds. No rales or wheezes. Mild anterior chest tenderness. Prolonged expiratory wheezes. No peripheral edema.   Abdominal: Soft. Bowel sounds are normal. He exhibits no distension.  There is no tenderness.  Musculoskeletal: Normal range of motion. He exhibits no edema and no tenderness.  Neurological: He is alert and oriented to person, place, and time. No sensory deficit.  Skin: Skin is warm and dry.  Psychiatric: He has a normal mood and affect. His behavior is normal.    ED Course  Procedures (including critical care time)  DIAGNOSTIC STUDIES: Oxygen Saturation is 96% on room air, adequate by my interpretation.    COORDINATION OF CARE:  20:09-Discussed planned course of treatment with the patient including an EKG and chest x-ray, who is agreeable at this time.   20:26-Medication Orders: Albuterol (Proventil HFA; Ventolin HFA) 108 (90 base) mcg/act inhaler 2 puff-every 4  hours PRN  22:00-Medication Orders: Oxycodone-acetaminophen (Percocet/Roxicet) 5-325 mg per tablet 1 tablet-once.   Results for orders placed during the hospital encounter of 11/03/11  D-DIMER, QUANTITATIVE      Component Value Range   D-Dimer, Quant <0.22  0.00 - 0.48 ug/mL-FEU     Dg Chest 2 View  11/03/2011  *RADIOLOGY REPORT*  Clinical Data: Chest pain and shortness of breath.  CHEST - 2 VIEW  Comparison: 03/03/2011  Findings: The cardiomediastinal silhouette is unremarkable. The lungs are clear. There is no evidence of focal airspace disease, pulmonary edema, suspicious pulmonary nodule/mass, pleural effusion, or pneumothorax. No acute bony abnormalities are identified.  IMPRESSION: No evidence of active cardiopulmonary disease.  Original Report Authenticated By: Rosendo Gros, M.D.     1. Chest pain     Date: 11/03/2011  Rate: 97  Rhythm: normal sinus rhythm  QRS Axis: right  Intervals: normal  ST/T Wave abnormalities: normal  Conduction Disutrbances:none  Narrative Interpretation:   Old EKG Reviewed: unchanged     MDM  Patient with retrosternal chest pain. Worse with breathing or movement. Said a nonproductive cough and mild nausea since. EKG is reassuring. D-dimer was done due to patient's smoking is negative. Patient feels somewhat better after treatment will be discharged home. He was given albuterol to to the cough and possible URI.  I personally performed the services described in this documentation, which was scribed in my presence. The recorded information has been reviewed and considered.         Juliet Rude. Rubin Payor, MD 11/03/11 2213

## 2011-11-03 NOTE — ED Notes (Signed)
Discharge instructions given and reviewed with patient.  Prescription given for Percocet; effects and use explained.  Patient verbalized understanding of sedating effects of Percocet and to follow up as needed.  Patient ambulatory; discharged home in good condition.  Significant other accompanied discharge to drive home.

## 2013-08-19 ENCOUNTER — Encounter (HOSPITAL_COMMUNITY): Payer: Self-pay | Admitting: Emergency Medicine

## 2013-08-19 ENCOUNTER — Emergency Department (HOSPITAL_COMMUNITY)
Admission: EM | Admit: 2013-08-19 | Discharge: 2013-08-19 | Disposition: A | Discharge status: Home / Self Care | Payer: Medicare Other | Attending: Emergency Medicine | Admitting: Emergency Medicine

## 2013-08-19 ENCOUNTER — Emergency Department (HOSPITAL_COMMUNITY): Payer: Medicare Other

## 2013-08-19 DIAGNOSIS — Y929 Unspecified place or not applicable: Secondary | ICD-10-CM | POA: Insufficient documentation

## 2013-08-19 DIAGNOSIS — F17200 Nicotine dependence, unspecified, uncomplicated: Secondary | ICD-10-CM | POA: Insufficient documentation

## 2013-08-19 DIAGNOSIS — Y9302 Activity, running: Secondary | ICD-10-CM | POA: Insufficient documentation

## 2013-08-19 DIAGNOSIS — S93402A Sprain of unspecified ligament of left ankle, initial encounter: Secondary | ICD-10-CM

## 2013-08-19 DIAGNOSIS — Z862 Personal history of diseases of the blood and blood-forming organs and certain disorders involving the immune mechanism: Secondary | ICD-10-CM | POA: Insufficient documentation

## 2013-08-19 DIAGNOSIS — S93409A Sprain of unspecified ligament of unspecified ankle, initial encounter: Secondary | ICD-10-CM | POA: Insufficient documentation

## 2013-08-19 DIAGNOSIS — Z8709 Personal history of other diseases of the respiratory system: Secondary | ICD-10-CM | POA: Insufficient documentation

## 2013-08-19 DIAGNOSIS — Z880 Allergy status to penicillin: Secondary | ICD-10-CM | POA: Insufficient documentation

## 2013-08-19 DIAGNOSIS — Z8639 Personal history of other endocrine, nutritional and metabolic disease: Secondary | ICD-10-CM | POA: Insufficient documentation

## 2013-08-19 DIAGNOSIS — X500XXA Overexertion from strenuous movement or load, initial encounter: Secondary | ICD-10-CM | POA: Insufficient documentation

## 2013-08-19 DIAGNOSIS — Z79899 Other long term (current) drug therapy: Secondary | ICD-10-CM | POA: Insufficient documentation

## 2013-08-19 MED ORDER — HYDROCODONE-ACETAMINOPHEN 5-325 MG PO TABS: 1.0000 | ORAL_TABLET | ORAL | Status: DC | PRN

## 2013-08-19 MED ORDER — MELOXICAM 7.5 MG PO TABS: 7.5000 mg | ORAL_TABLET | Freq: Every day | ORAL | Status: DC

## 2013-08-19 NOTE — ED Notes (Signed)
Pt attempted to go to restroom on his own. This nurse came out of another pt room to find him in floor. Pt apologized for trying to go on his on. Pt states he is ok. Charge nurse notified.

## 2013-08-19 NOTE — ED Notes (Addendum)
Swelling noted to the left ankle. Pt states she twisted when chasing someone. Pt did state he has drank 15 beers today.

## 2013-08-19 NOTE — Discharge Instructions (Signed)

## 2013-08-19 NOTE — ED Provider Notes (Signed)
Medical screening examination/treatment/procedure(s) were performed by non-physician practitioner and as supervising physician I was immediately available for consultation/collaboration.   EKG Interpretation None        Glynn OctaveStephen Eulan Heyward, MD 08/19/13 2315

## 2013-08-19 NOTE — ED Notes (Signed)
Pt alert & oriented x4. Patient given discharge instructions, paperwork & prescription(s). Patient instructed to stop at the registration desk to finish any additional paperwork. Patient verbalized understanding. Pt left department w/ no further questions. 

## 2013-08-19 NOTE — ED Provider Notes (Signed)
CSN: [id number]     Arrival date & time 08/19/13  2134 History   First MD Initiated Contact with Patient 08/19/13 2247     Chief Complaint  Patient presents with  . Ankle Pain     (Consider location/radiation/quality/duration/timing/severity/associated sxs/prior Treatment) Patient is a 38 y.o. male presenting with ankle pain. The history is provided by the patient.  Ankle Pain Location:  Ankle Time since incident:  1 hour Injury: yes   Mechanism of injury: fall   Fall:    Entrapped after fall: no   Ankle location:  L ankle  Richard Hampton is a 38 y.o. male who presents to the ED with pain in his left ankle. He states that someone called his daughter a name and he was running after them and stepped in a ditch and  twisted his ankle. He complains of pain in the medial and lateral aspect of the ankle. He denies any other injuries.   Past Medical History  Diagnosis Date  . Hyperlipidemia   . Collapsed lung     right side   Past Surgical History  Procedure Laterality Date  . Spine surgery     No family history on file. History  Substance Use Topics  . Smoking status: Current Every Day Smoker -- 0.50 packs/day    Types: Cigarettes  . Smokeless tobacco: Not on file  . Alcohol Use: Yes     Comment: occ    Review of Systems Negative except as stated in HPI   Allergies  Penicillins  Home Medications   Prior to Admission medications   Medication Sig Start Date End Date Taking? Authorizing Provider  ALPRAZolam Prudy Feeler(XANAX) 1 MG tablet Take 1 mg by mouth 4 (four) times daily as needed. For anxiety    Historical Provider, MD  escitalopram (LEXAPRO) 20 MG tablet Take 20 mg by mouth daily.     Historical Provider, MD  naproxen (NAPROSYN) 500 MG tablet Take 500 mg by mouth 2 (two) times daily with a meal.      Historical Provider, MD  QUEtiapine (SEROQUEL) 50 MG tablet Take 50 mg by mouth at bedtime.      Historical Provider, MD  risperiDONE (RISPERDAL) 1 MG tablet Take 1 mg by  mouth 2 (two) times daily.      Historical Provider, MD   BP 121/73  Pulse 99  Temp(Src) 98.4 F (36.9 C) (Oral)  Resp 16  Ht 6\' 3"  (1.905 m)  Wt 225 lb (102.059 kg)  BMI 28.12 kg/m2  SpO2 96% Physical Exam  Nursing note and vitals reviewed. Constitutional: He is oriented to person, place, and time. He appears well-developed and well-nourished. No distress.  HENT:  Head: Normocephalic.  Eyes: EOM are normal.  Neck: Neck supple.  Cardiovascular: Normal rate.   Pulmonary/Chest: Effort normal.  Musculoskeletal:       Left ankle: He exhibits decreased range of motion (due to pain). He exhibits no ecchymosis, no deformity, no laceration and normal pulse. Swelling: minimal. Tenderness. Lateral malleolus and medial malleolus tenderness found. Achilles tendon normal.  Pedal pulse strong, adequate circulation, good touch sensation. Pain with palpation of medial and lateral aspect of the ankle.   Neurological: He is alert and oriented to person, place, and time. No cranial nerve deficit.  Skin: Skin is warm and dry.  Psychiatric: He has a normal mood and affect. His behavior is normal.    ED Course  Procedures (including critical care time) Labs Review Labs Reviewed - No data to  display  Imaging Review Dg Ankle Complete Left  08/19/2013   CLINICAL DATA:  pain  EXAM: LEFT ANKLE COMPLETE - 3+ VIEW  COMPARISON:  None.  FINDINGS: There is no evidence of fracture, dislocation, or joint effusion. There is no evidence of arthropathy or other focal bone abnormality. Soft tissues are unremarkable.  IMPRESSION: Negative.   Electronically Signed   By: Salome HolmesHector  Cooper M.D.   On: 08/19/2013 22:32    MDM  38 y.o. male with left ankle pain s/p injury just prior to arrival to the ED. Placed in ASO, crutches, ice and elevation. He will follow up with ortho. Discussed with the patient clinical and x-ray findings and plan of care. All questioned fully answered.    Medication List    TAKE these  medications       HYDROcodone-acetaminophen 5-325 MG per tablet  Commonly known as:  NORCO/VICODIN  Take 1 tablet by mouth every 4 (four) hours as needed.     meloxicam 7.5 MG tablet  Commonly known as:  MOBIC  Take 1 tablet (7.5 mg total) by mouth daily.      ASK your doctor about these medications       ALPRAZolam 1 MG tablet  Commonly known as:  XANAX  Take 1 mg by mouth 4 (four) times daily as needed. For anxiety     escitalopram 20 MG tablet  Commonly known as:  LEXAPRO  Take 20 mg by mouth daily.     naproxen 500 MG tablet  Commonly known as:  NAPROSYN  Take 500 mg by mouth 2 (two) times daily with a meal.     QUEtiapine 50 MG tablet  Commonly known as:  SEROQUEL  Take 50 mg by mouth at bedtime.     risperiDONE 1 MG tablet  Commonly known as:  RISPERDAL  Take 1 mg by mouth 2 (two) times daily.           LincolnHope M Neese, [place] 08/19/13 2303

## 2013-08-19 NOTE — ED Notes (Signed)
Was chasing a guy that called my daughter a name. Twisted my left ankle per pt.

## 2015-10-07 ENCOUNTER — Emergency Department (HOSPITAL_COMMUNITY)
Admission: EM | Admit: 2015-10-07 | Discharge: 2015-10-07 | Disposition: A | Discharge status: Home / Self Care | Payer: Medicare Other | Attending: Emergency Medicine | Admitting: Emergency Medicine

## 2015-10-07 ENCOUNTER — Emergency Department (HOSPITAL_COMMUNITY): Payer: Medicare Other

## 2015-10-07 ENCOUNTER — Encounter (HOSPITAL_COMMUNITY): Payer: Self-pay | Admitting: Emergency Medicine

## 2015-10-07 DIAGNOSIS — R079 Chest pain, unspecified: Secondary | ICD-10-CM

## 2015-10-07 DIAGNOSIS — R0602 Shortness of breath: Secondary | ICD-10-CM | POA: Insufficient documentation

## 2015-10-07 DIAGNOSIS — Z791 Long term (current) use of non-steroidal anti-inflammatories (NSAID): Secondary | ICD-10-CM | POA: Insufficient documentation

## 2015-10-07 DIAGNOSIS — F17210 Nicotine dependence, cigarettes, uncomplicated: Secondary | ICD-10-CM | POA: Diagnosis not present

## 2015-10-07 DIAGNOSIS — E785 Hyperlipidemia, unspecified: Secondary | ICD-10-CM | POA: Insufficient documentation

## 2015-10-07 DIAGNOSIS — J40 Bronchitis, not specified as acute or chronic: Secondary | ICD-10-CM | POA: Diagnosis not present

## 2015-10-07 LAB — D-DIMER, QUANTITATIVE (NOT AT ARMC): D DIMER QUANT: 0.39 ug{FEU}/mL (ref 0.00–0.50)

## 2015-10-07 LAB — TROPONIN I

## 2015-10-07 LAB — BASIC METABOLIC PANEL
ANION GAP: 6 (ref 5–15)
BUN: 10 mg/dL (ref 6–20)
CALCIUM: 9.2 mg/dL (ref 8.9–10.3)
CO2: 26 mmol/L (ref 22–32)
Chloride: 106 mmol/L (ref 101–111)
Creatinine, Ser: 0.93 mg/dL (ref 0.61–1.24)
GFR calc Af Amer: >60 mL/min (ref >60)
GFR calc non Af Amer: >60 mL/min (ref >60)
GLUCOSE: 96 mg/dL (ref 65–99)
POTASSIUM: 3.8 mmol/L (ref 3.5–5.1)
SODIUM: 138 mmol/L (ref 135–145)

## 2015-10-07 LAB — CBC
HEMATOCRIT: 46.5 % (ref 39.0–52.0)
HEMOGLOBIN: 16 g/dL (ref 13.0–17.0)
MCH: 27.4 pg (ref 26.0–34.0)
MCHC: 34.4 g/dL (ref 30.0–36.0)
MCV: 79.6 fL (ref 78.0–100.0)
Platelets: 200 10*3/uL (ref 150–400)
RBC: 5.84 MIL/uL — ABNORMAL HIGH (ref 4.22–5.81)
RDW: 14.1 % (ref 11.5–15.5)
WBC: 10.3 10*3/uL (ref 4.0–10.5)

## 2015-10-07 LAB — I-STAT TROPONIN, ED: TROPONIN I, POC: 0 ng/mL (ref 0.00–0.08)

## 2015-10-07 MED ORDER — PREDNISONE 20 MG PO TABS
40.0000 mg | ORAL_TABLET | Freq: Once | ORAL | Status: AC
Start: 2015-10-07 — End: 2015-10-07
  Administered 2015-10-07: 40 mg via ORAL
  Filled 2015-10-07: qty 2

## 2015-10-07 MED ORDER — IBUPROFEN 800 MG PO TABS
800.0000 mg | ORAL_TABLET | Freq: Three times a day (TID) | ORAL | Status: DC
Start: 2015-10-07 — End: 2016-10-08

## 2015-10-07 MED ORDER — HYDROCODONE-ACETAMINOPHEN 5-325 MG PO TABS
1.0000 | ORAL_TABLET | Freq: Once | ORAL | Status: AC
  Administered 2015-10-07: 1 via ORAL
  Filled 2015-10-07: qty 1

## 2015-10-07 MED ORDER — IPRATROPIUM-ALBUTEROL 0.5-2.5 (3) MG/3ML IN SOLN
3.0000 mL | Freq: Once | RESPIRATORY_TRACT | Status: AC
  Administered 2015-10-07: 3 mL via RESPIRATORY_TRACT
  Filled 2015-10-07: qty 3

## 2015-10-07 MED ORDER — KETOROLAC TROMETHAMINE 30 MG/ML IJ SOLN
30.0000 mg | Freq: Once | INTRAMUSCULAR | Status: AC
  Administered 2015-10-07: 30 mg via INTRAVENOUS
  Filled 2015-10-07: qty 1

## 2015-10-07 MED ORDER — IPRATROPIUM-ALBUTEROL 0.5-2.5 (3) MG/3ML IN SOLN
3.0000 mL | RESPIRATORY_TRACT | Status: DC
  Administered 2015-10-07: 3 mL via RESPIRATORY_TRACT
  Filled 2015-10-07: qty 3

## 2015-10-07 MED ORDER — ALBUTEROL SULFATE HFA 108 (90 BASE) MCG/ACT IN AERS
2.0000 | INHALATION_SPRAY | Freq: Once | RESPIRATORY_TRACT | Status: AC
  Administered 2015-10-07: 2 via RESPIRATORY_TRACT
  Filled 2015-10-07: qty 6.7

## 2015-10-07 MED ORDER — PREDNISONE 20 MG PO TABS: ORAL_TABLET | ORAL | Status: DC

## 2015-10-07 NOTE — ED Provider Notes (Signed)
CSN: [id number]     Arrival date & time 10/07/15  1835 History   First MD Initiated Contact with Patient 10/07/15 1839     Chief Complaint  Patient presents with  . Chest Pain     (Consider location/radiation/quality/duration/timing/severity/associated sxs/prior Treatment) Patient is a 40 y.o. male presenting with chest pain.  Chest Pain Pain location:  L chest Pain quality: sharp   Pain radiates to:  Does not radiate Pain radiates to the back: no   Pain severity:  Mild Onset quality:  Gradual Timing:  Intermittent Chronicity:  New Context: breathing, movement and raising an arm   Associated symptoms: cough and shortness of breath     Past Medical History  Diagnosis Date  . Hyperlipidemia   . Collapsed lung     right side   Past Surgical History  Procedure Laterality Date  . Spine surgery     Family History  Problem Relation Age of Onset  . Asthma Mother    Social History  Substance Use Topics  . Smoking status: Current Every Day Smoker -- 1.00 packs/day    Types: Cigarettes  . Smokeless tobacco: Current User    Types: Snuff  . Alcohol Use: No    Review of Systems  Eyes: Negative for pain.  Respiratory: Positive for cough and shortness of breath. Negative for chest tightness.   Cardiovascular: Positive for chest pain.  Endocrine: Negative for polydipsia and polyuria.  All other systems reviewed and are negative.     Allergies  Penicillins  Home Medications   Prior to Admission medications   Medication Sig Start Date End Date Taking? Authorizing Provider  ibuprofen (ADVIL,MOTRIN) 800 MG tablet Take 1 tablet (800 mg total) by mouth 3 (three) times daily. 10/07/15   Marily MemosJason Selicia Windom, MD  predniSONE (DELTASONE) 20 MG tablet 2 tabs po daily x 4 days 10/08/15   Marily MemosJason Mahiya Kercheval, MD   BP 114/72 mmHg  Pulse 94  Temp(Src) 97.9 F (36.6 C) (Oral)  Resp 16  Ht 6\' 3"  (1.905 m)  Wt 213 lb (96.616 kg)  BMI 26.62 kg/m2  SpO2 96% Physical Exam  Constitutional: He is  oriented to person, place, and time. He appears well-developed and well-nourished.  HENT:  Head: Normocephalic and atraumatic.  Eyes: Conjunctivae are normal. Pupils are equal, round, and reactive to light.  Neck: Normal range of motion.  Cardiovascular: Normal rate.   Pulmonary/Chest: Effort normal. No respiratory distress. He exhibits tenderness (left costochondral border).  Abdominal: He exhibits no distension. There is no rebound.  Musculoskeletal: Normal range of motion. He exhibits no edema or tenderness.  Neurological: He is alert and oriented to person, place, and time.  Skin: Skin is warm and dry. No rash noted.  Nursing note and vitals reviewed.   ED Course  Procedures (including critical care time) Labs Review Labs Reviewed  CBC - Abnormal; Notable for the following:    RBC 5.84 (*)    All other components within normal limits  BASIC METABOLIC PANEL  D-DIMER, QUANTITATIVE (NOT AT [facility])  TROPONIN I  Rosezena SensorI-STAT TROPOININ, ED    Imaging Review Dg Chest 2 View  10/07/2015  CLINICAL DATA:  Left-sided chest pain beginning last night. History of pneumothorax. EXAM: CHEST  2 VIEW COMPARISON:  11/03/2011 and multiple previous FINDINGS: Heart size is normal. Mediastinal shadows are normal. The lungs are clear. No effusions. No bony abnormalities. IMPRESSION: Normal chest Electronically Signed   By: Paulina FusiMark  Shogry M.D.   On: 10/07/2015 19:45   I  have personally reviewed and evaluated these images and lab results as part of my medical decision-making.   EKG Interpretation   Date/Time:  Friday October 07 2015 22:15:58 EDT Ventricular Rate:  91 PR Interval:    QRS Duration: 99 QT Interval:  355 QTC Calculation: 437 R Axis:   107 Text Interpretation:  Sinus rhythm Right axis deviation Confirmed by  Ruie Sendejo MD, Barbara CowerJASON [phone] on 10/07/2015 10:23:33 PM      MDM   Final diagnoses:  Bronchitis  Chest pain, unspecified chest pain type   Nonspecific chest pain. Likely related to  bronchitis.  Low HEART score, atypical type pain, delta troponins negative, ecg unchanged without evidence of ischemia, all making ACS unlikely.  Low Well's Score, low d dimer, doubt PE.  No e/o Pneumonia, no indication for antibiotics.  Based on H&P, low concern for dissection, pneumothorax, pericarditis, boerhaves or other emergent causes for their symptoms at this time.    New Prescriptions: Discharge Medication List as of 10/07/2015 10:49 PM    START taking these medications   Details  ibuprofen (ADVIL,MOTRIN) 800 MG tablet Take 1 tablet (800 mg total) by mouth 3 (three) times daily., Starting 10/07/2015, Until Discontinued, Print    predniSONE (DELTASONE) 20 MG tablet 2 tabs po daily x 4 days, Print        I have personally and contemperaneously reviewed labs and imaging and used in my decision making as above.   A medical screening exam was performed and I feel the patient has had an appropriate workup for their chief complaint at this time and likelihood of emergent condition existing is low and thus workup can continue on an outpatient basis.. Their vital signs are stable. They have been counseled on decision, discharge, follow up and which symptoms necessitate immediate return to the emergency department.  They verbally stated understanding and agreement with plan and discharged in stable condition.      Marily MemosJason Srijan Givan, MD 10/07/15 [phone]

## 2015-10-07 NOTE — ED Notes (Signed)
Pt states he was awakened this am at approx 0200 by chest pain. Pt reports diaphoresis and dizziness. Pt states pain has been constant since onset. Pt reports pain is worse when he takes a deep breath.

## 2015-11-08 ENCOUNTER — Other Ambulatory Visit (HOSPITAL_COMMUNITY): Payer: Self-pay | Admitting: Ophthalmology

## 2015-11-21 ENCOUNTER — Other Ambulatory Visit (HOSPITAL_COMMUNITY): Payer: Self-pay | Admitting: Ophthalmology

## 2015-11-21 ENCOUNTER — Ambulatory Visit (HOSPITAL_COMMUNITY)
Admission: RE | Admit: 2015-11-21 | Discharge: 2015-11-21 | Disposition: A | Discharge status: Home / Self Care | Payer: Medicare Other | Source: Ambulatory Visit | Attending: Ophthalmology | Admitting: Ophthalmology

## 2015-11-21 DIAGNOSIS — H5789 Other specified disorders of eye and adnexa: Secondary | ICD-10-CM

## 2015-11-21 DIAGNOSIS — Z7689 Persons encountering health services in other specified circumstances: Secondary | ICD-10-CM | POA: Diagnosis present

## 2015-11-21 DIAGNOSIS — M5137 Other intervertebral disc degeneration, lumbosacral region: Secondary | ICD-10-CM | POA: Insufficient documentation

## 2016-01-12 ENCOUNTER — Other Ambulatory Visit (HOSPITAL_COMMUNITY): Payer: Self-pay | Admitting: Internal Medicine

## 2016-01-12 DIAGNOSIS — M545 Low back pain: Secondary | ICD-10-CM

## 2016-01-24 ENCOUNTER — Ambulatory Visit (HOSPITAL_COMMUNITY)
Admission: RE | Admit: 2016-01-24 | Discharge: 2016-01-24 | Disposition: A | Discharge status: Home / Self Care | Payer: Medicare Other | Source: Ambulatory Visit | Attending: Internal Medicine | Admitting: Internal Medicine

## 2016-01-24 ENCOUNTER — Other Ambulatory Visit (HOSPITAL_COMMUNITY): Payer: Self-pay | Admitting: Internal Medicine

## 2016-01-24 DIAGNOSIS — M545 Low back pain: Secondary | ICD-10-CM

## 2016-01-24 DIAGNOSIS — M5126 Other intervertebral disc displacement, lumbar region: Secondary | ICD-10-CM | POA: Insufficient documentation

## 2016-01-24 DIAGNOSIS — M5137 Other intervertebral disc degeneration, lumbosacral region: Secondary | ICD-10-CM | POA: Insufficient documentation

## 2016-02-27 ENCOUNTER — Other Ambulatory Visit (HOSPITAL_COMMUNITY): Payer: Self-pay | Admitting: Neurosurgery

## 2016-02-27 DIAGNOSIS — M5416 Radiculopathy, lumbar region: Secondary | ICD-10-CM

## 2016-03-07 ENCOUNTER — Ambulatory Visit (HOSPITAL_COMMUNITY)
Admission: RE | Admit: 2016-03-07 | Discharge: 2016-03-07 | Disposition: A | Discharge status: Home / Self Care | Payer: Medicare Other | Source: Ambulatory Visit | Attending: Neurosurgery | Admitting: Neurosurgery

## 2016-03-07 DIAGNOSIS — M5127 Other intervertebral disc displacement, lumbosacral region: Secondary | ICD-10-CM | POA: Insufficient documentation

## 2016-03-07 DIAGNOSIS — M5416 Radiculopathy, lumbar region: Secondary | ICD-10-CM | POA: Diagnosis present

## 2016-03-07 DIAGNOSIS — M5136 Other intervertebral disc degeneration, lumbar region: Secondary | ICD-10-CM | POA: Insufficient documentation

## 2016-03-07 MED ORDER — GADOBENATE DIMEGLUMINE 529 MG/ML IV SOLN
20.0000 mL | Freq: Once | INTRAVENOUS | Status: AC | PRN
  Administered 2016-03-07: 20 mL via INTRAVENOUS

## 2016-04-03 DIAGNOSIS — L309 Dermatitis, unspecified: Secondary | ICD-10-CM | POA: Diagnosis not present

## 2016-04-03 DIAGNOSIS — F17200 Nicotine dependence, unspecified, uncomplicated: Secondary | ICD-10-CM | POA: Diagnosis not present

## 2016-04-03 DIAGNOSIS — M545 Low back pain: Secondary | ICD-10-CM | POA: Diagnosis not present

## 2016-04-24 DIAGNOSIS — R21 Rash and other nonspecific skin eruption: Secondary | ICD-10-CM | POA: Diagnosis not present

## 2016-05-02 ENCOUNTER — Emergency Department (HOSPITAL_COMMUNITY): Payer: Medicare Other

## 2016-05-02 ENCOUNTER — Encounter (HOSPITAL_COMMUNITY): Payer: Self-pay | Admitting: Emergency Medicine

## 2016-05-02 ENCOUNTER — Emergency Department (HOSPITAL_COMMUNITY)
Admission: EM | Admit: 2016-05-02 | Discharge: 2016-05-02 | Disposition: A | Discharge status: Home / Self Care | Payer: Medicare Other | Attending: Emergency Medicine | Admitting: Emergency Medicine

## 2016-05-02 DIAGNOSIS — F17210 Nicotine dependence, cigarettes, uncomplicated: Secondary | ICD-10-CM | POA: Insufficient documentation

## 2016-05-02 DIAGNOSIS — M25512 Pain in left shoulder: Secondary | ICD-10-CM | POA: Diagnosis present

## 2016-05-02 DIAGNOSIS — F17290 Nicotine dependence, other tobacco product, uncomplicated: Secondary | ICD-10-CM | POA: Insufficient documentation

## 2016-05-02 DIAGNOSIS — Z791 Long term (current) use of non-steroidal anti-inflammatories (NSAID): Secondary | ICD-10-CM | POA: Diagnosis not present

## 2016-05-02 DIAGNOSIS — M549 Dorsalgia, unspecified: Secondary | ICD-10-CM | POA: Diagnosis not present

## 2016-05-02 DIAGNOSIS — R079 Chest pain, unspecified: Secondary | ICD-10-CM | POA: Diagnosis not present

## 2016-05-02 LAB — BASIC METABOLIC PANEL
Anion gap: 9 (ref 5–15)
BUN: 17 mg/dL (ref 6–20)
CALCIUM: 9.3 mg/dL (ref 8.9–10.3)
CO2: 30 mmol/L (ref 22–32)
CREATININE: 0.97 mg/dL (ref 0.61–1.24)
Chloride: 100 mmol/L — ABNORMAL LOW (ref 101–111)
Glucose, Bld: 87 mg/dL (ref 65–99)
Potassium: 4.1 mmol/L (ref 3.5–5.1)
SODIUM: 139 mmol/L (ref 135–145)

## 2016-05-02 LAB — CBC
HCT: 47.1 % (ref 39.0–52.0)
Hemoglobin: 15.4 g/dL (ref 13.0–17.0)
MCH: 26.7 pg (ref 26.0–34.0)
MCHC: 32.7 g/dL (ref 30.0–36.0)
MCV: 81.8 fL (ref 78.0–100.0)
PLATELETS: 215 10*3/uL (ref 150–400)
RBC: 5.76 MIL/uL (ref 4.22–5.81)
RDW: 14.4 % (ref 11.5–15.5)
WBC: 8 10*3/uL (ref 4.0–10.5)

## 2016-05-02 LAB — TROPONIN I

## 2016-05-02 MED ORDER — NAPROXEN 500 MG PO TABS: 500.0000 mg | ORAL_TABLET | Freq: Two times a day (BID) | ORAL | 0 refills | Status: DC

## 2016-05-02 MED ORDER — CYCLOBENZAPRINE HCL 10 MG PO TABS: 15.0000 mg | ORAL_TABLET | Freq: Two times a day (BID) | ORAL | 0 refills | Status: DC | PRN

## 2016-05-02 MED ORDER — KETOROLAC TROMETHAMINE 30 MG/ML IJ SOLN
30.0000 mg | Freq: Once | INTRAMUSCULAR | Status: AC
  Administered 2016-05-02: 30 mg via INTRAVENOUS
  Filled 2016-05-02: qty 1

## 2016-05-02 NOTE — ED Notes (Signed)
EKG given to Dr. Adriana Simasook, also discussed intermittent numbness to left arm.

## 2016-05-02 NOTE — ED Triage Notes (Addendum)
Pt reports pain in left chest radiating to left arm and left shoulder blade x2 days, burning sensation. Pt denies n/v/d/sob/weakness. Pt alert and oriented at this time. Pt also reports intermittent numbness to left arm.

## 2016-05-02 NOTE — Discharge Instructions (Signed)
Tests were good. No evidence of a heart attack. Prescription for pain medicine and muscle relaxer.

## 2016-05-02 NOTE — ED Provider Notes (Signed)
[facility]-EMERGENCY DEPT Provider Note   CSN: [id number] Arrival date & time: 05/02/16  1659     History   Chief Complaint Chief Complaint  Patient presents with  . Chest Pain    HPI Richard Hampton is a 41 y.o. male.  Pain in left shoulder radiating to left upper back intermittently for 24 hours. Questionable numbness to left arm. No frank substernal chest pain, diaphoresis, dyspnea, nausea. No history of cardiac disease. No trauma. No diabetes or hypertension.      Past Medical History:  Diagnosis Date  . Collapsed lung    right side  . Hyperlipidemia     Patient Active Problem List   Diagnosis Date Noted  . H N P-LUMBAR 12/08/2008  . LOW BACK PAIN 12/08/2008  . SCIATICA 12/08/2008  . UNSPECIFIED MONONEURITIS OF UPPER LIMB 06/04/2007    Past Surgical History:  Procedure Laterality Date  . SPINE SURGERY         Home Medications    Prior to Admission medications   Medication Sig Start Date End Date Taking? Authorizing Provider  ibuprofen (ADVIL,MOTRIN) 800 MG tablet Take 1 tablet (800 mg total) by mouth 3 (three) times daily. Patient taking differently: Take 800 mg by mouth every 8 (eight) hours as needed for mild pain or moderate pain.  10/07/15  Yes Marily Memos, MD  cyclobenzaprine (FLEXERIL) 10 MG tablet Take 1.5 tablets (15 mg total) by mouth 2 (two) times daily as needed for muscle spasms. 05/02/16   Donnetta Hutching, MD  naproxen (NAPROSYN) 500 MG tablet Take 1 tablet (500 mg total) by mouth 2 (two) times daily. 05/02/16   Donnetta Hutching, MD    Family History Family History  Problem Relation Age of Onset  . Asthma Mother     Social History Social History  Substance Use Topics  . Smoking status: Current Every Day Smoker    Packs/day: 1.00    Types: Cigarettes  . Smokeless tobacco: Current User    Types: Snuff  . Alcohol use No     Allergies   Penicillins   Review of Systems Review of Systems  All other systems reviewed and are  negative.    Physical Exam Updated Vital Signs BP 134/80   Pulse 80   Temp 98.9 F (37.2 C) (Temporal)   Resp 19   Ht 6\' 3"  (1.905 m)   Wt 215 lb (97.5 kg)   SpO2 95%   BMI 26.87 kg/m   Physical Exam  Constitutional: He is oriented to person, place, and time. He appears well-developed and well-nourished.  HENT:  Head: Normocephalic and atraumatic.  Eyes: Conjunctivae are normal.  Neck: Neck supple.  Cardiovascular: Normal rate and regular rhythm.   Pulmonary/Chest: Effort normal and breath sounds normal.  Abdominal: Soft. Bowel sounds are normal.  Musculoskeletal:  Minimal generalized tenderness surrounding the right shoulder and left upper back  Neurological: He is alert and oriented to person, place, and time.  Skin: Skin is warm and dry.  Psychiatric: He has a normal mood and affect. His behavior is normal.  Nursing note and vitals reviewed.    ED Treatments / Results  Labs (all labs ordered are listed, but only abnormal results are displayed) Labs Reviewed  BASIC METABOLIC PANEL - Abnormal; Notable for the following:       Result Value   Chloride 100 (*)    All other components within normal limits  CBC  TROPONIN I    EKG  EKG Interpretation  Date/Time:  Wednesday May 02 2016 17:07:04 EST Ventricular Rate:  104 PR Interval:  174 QRS Duration: 92 QT Interval:  326 QTC Calculation: 428 R Axis:   97 Text Interpretation:  Sinus tachycardia Possible Left atrial enlargement Rightward axis Incomplete right bundle branch block Borderline ECG Confirmed by Adriana SimasOOK  MD, Juan Kissoon ([id number]) on 05/02/2016 7:31:53 PM       Radiology Dg Chest 2 View  Result Date: 05/02/2016 CLINICAL DATA:  Acute onset of left-sided chest pain, radiating to left arm and left scapula, with burning sensation. Left arm numbness. Initial encounter. EXAM: CHEST  2 VIEW COMPARISON:  Chest radiograph performed 10/07/2015 FINDINGS: The lungs are well-aerated and clear. There is no evidence of  focal opacification, pleural effusion or pneumothorax. The heart is normal in size; the mediastinal contour is within normal limits. No acute osseous abnormalities are seen. IMPRESSION: No acute cardiopulmonary process seen. Electronically Signed   By: Roanna RaiderJeffery  Chang M.D.   On: 05/02/2016 17:22    Procedures Procedures (including critical care time)  Medications Ordered in ED Medications  ketorolac (TORADOL) 30 MG/ML injection 30 mg (30 mg Intravenous Given 05/02/16 1923)     Initial Impression / Assessment and Plan / ED Course  I have reviewed the triage vital signs and the nursing notes.  Pertinent labs & imaging results that were available during my care of the patient were reviewed by me and considered in my medical decision making (see chart for details).     History and physical not consistent with CAD or PE. It appears to be more musculoskeletal. Discharge medications Flexeril 10 mg and Naprosyn 500 mg  Final Clinical Impressions(s) / ED Diagnoses   Final diagnoses:  Upper back pain    New Prescriptions New Prescriptions   CYCLOBENZAPRINE (FLEXERIL) 10 MG TABLET    Take 1.5 tablets (15 mg total) by mouth 2 (two) times daily as needed for muscle spasms.   NAPROXEN (NAPROSYN) 500 MG TABLET    Take 1 tablet (500 mg total) by mouth 2 (two) times daily.     Donnetta HutchingBrian Angy Swearengin, MD 05/02/16 [id number]

## 2016-05-31 DIAGNOSIS — L408 Other psoriasis: Secondary | ICD-10-CM | POA: Diagnosis not present

## 2016-06-21 DIAGNOSIS — L408 Other psoriasis: Secondary | ICD-10-CM | POA: Diagnosis not present

## 2016-10-08 ENCOUNTER — Emergency Department (HOSPITAL_COMMUNITY)
Admission: EM | Admit: 2016-10-08 | Discharge: 2016-10-08 | Disposition: A | Discharge status: Home / Self Care | Payer: Medicare Other | Attending: Emergency Medicine | Admitting: Emergency Medicine

## 2016-10-08 ENCOUNTER — Emergency Department (HOSPITAL_COMMUNITY): Payer: Medicare Other

## 2016-10-08 ENCOUNTER — Encounter (HOSPITAL_COMMUNITY): Payer: Self-pay | Admitting: Emergency Medicine

## 2016-10-08 DIAGNOSIS — F17210 Nicotine dependence, cigarettes, uncomplicated: Secondary | ICD-10-CM | POA: Insufficient documentation

## 2016-10-08 DIAGNOSIS — F17220 Nicotine dependence, chewing tobacco, uncomplicated: Secondary | ICD-10-CM | POA: Diagnosis not present

## 2016-10-08 DIAGNOSIS — Z8709 Personal history of other diseases of the respiratory system: Secondary | ICD-10-CM | POA: Diagnosis not present

## 2016-10-08 DIAGNOSIS — R05 Cough: Secondary | ICD-10-CM | POA: Diagnosis not present

## 2016-10-08 DIAGNOSIS — R0789 Other chest pain: Secondary | ICD-10-CM | POA: Insufficient documentation

## 2016-10-08 DIAGNOSIS — R059 Cough, unspecified: Secondary | ICD-10-CM

## 2016-10-08 DIAGNOSIS — R079 Chest pain, unspecified: Secondary | ICD-10-CM | POA: Diagnosis not present

## 2016-10-08 MED ORDER — IBUPROFEN 800 MG PO TABS: 800.0000 mg | ORAL_TABLET | Freq: Three times a day (TID) | ORAL | 0 refills | Status: DC

## 2016-10-08 MED ORDER — ALBUTEROL SULFATE HFA 108 (90 BASE) MCG/ACT IN AERS
2.0000 | INHALATION_SPRAY | Freq: Once | RESPIRATORY_TRACT | Status: AC
  Administered 2016-10-08: 2 via RESPIRATORY_TRACT
  Filled 2016-10-08: qty 6.7

## 2016-10-08 MED ORDER — PREDNISONE 20 MG PO TABS: 40.0000 mg | ORAL_TABLET | Freq: Every day | ORAL | 0 refills | Status: DC

## 2016-10-08 MED ORDER — AZITHROMYCIN 250 MG PO TABS: ORAL_TABLET | ORAL | 0 refills | Status: DC

## 2016-10-08 NOTE — Discharge Instructions (Signed)
1-2 puffs of the inhaler every 4-6 hrs as needed.  Follow-up with your doctor or return here for any worsening symptoms

## 2016-10-08 NOTE — ED Notes (Signed)
Rt aware of inhaler

## 2016-10-08 NOTE — ED Triage Notes (Signed)
Pt reports dry, non productive cough x2 weeks, now c/o rib pain for 2 days. Hx of collapsed lung d/t "coughing" per pt. States he feels like he can't take a full breath.

## 2016-10-08 NOTE — ED Notes (Signed)
Pt returned from xray

## 2016-10-10 NOTE — ED Provider Notes (Signed)
[facility]-EMERGENCY DEPT Provider Note   CSN: [id number] Arrival date & time: 10/08/16  1428     History   Chief Complaint Chief Complaint  Patient presents with  . Cough  . rib pain    HPI Richard Hampton is a 41 y.o. male.  HPI   Richard KellyJames R Chaudhari is a 41 y.o. male with hx of pneumothorax of right lung 2006 who presents to the Emergency Department complaining of non-productive cough for 2 weeks.  States that cough is forceful at times and now has pain to his right ribs associated with coughing.  He describes a sharp pain to his right ribs and back with cough and movement.  He has tried OTC medications without relief.  He denies fever, chills, shortness of breath and wheezing.     Past Medical History:  Diagnosis Date  . Collapsed lung    right side  . Hyperlipidemia     Patient Active Problem List   Diagnosis Date Noted  . H N P-LUMBAR 12/08/2008  . LOW BACK PAIN 12/08/2008  . SCIATICA 12/08/2008  . UNSPECIFIED MONONEURITIS OF UPPER LIMB 06/04/2007    Past Surgical History:  Procedure Laterality Date  . SPINE SURGERY         Home Medications    Prior to Admission medications   Medication Sig Start Date End Date Taking? Authorizing Provider  azithromycin (ZITHROMAX) 250 MG tablet Take first 2 tablets together, then 1 every day until finished. 10/08/16   Aubreyanna Dorrough, PA-C  cyclobenzaprine (FLEXERIL) 10 MG tablet Take 1.5 tablets (15 mg total) by mouth 2 (two) times daily as needed for muscle spasms. 05/02/16   Donnetta Hutchingook, Brian, MD  ibuprofen (ADVIL,MOTRIN) 800 MG tablet Take 1 tablet (800 mg total) by mouth 3 (three) times daily. 10/08/16   Davielle Lingelbach, PA-C  naproxen (NAPROSYN) 500 MG tablet Take 1 tablet (500 mg total) by mouth 2 (two) times daily. 05/02/16   Donnetta Hutchingook, Brian, MD  predniSONE (DELTASONE) 20 MG tablet Take 2 tablets (40 mg total) by mouth daily. For 5 days 10/08/16   Pauline Ausriplett, Tisheena Maguire, PA-C    Family History Family History  Problem Relation Age of Onset  .  Asthma Mother     Social History Social History  Substance Use Topics  . Smoking status: Current Every Day Smoker    Packs/day: 2.00    Types: Cigarettes  . Smokeless tobacco: Current User    Types: Snuff  . Alcohol use No     Allergies   Penicillins   Review of Systems Review of Systems  Constitutional: Negative for appetite change, chills and fever.  HENT: Negative for congestion, sore throat and trouble swallowing.   Respiratory: Positive for cough and chest tightness. Negative for shortness of breath and wheezing.   Cardiovascular: Negative for chest pain.  Gastrointestinal: Negative for abdominal pain, nausea and vomiting.  Genitourinary: Negative for dysuria.  Musculoskeletal: Negative for arthralgias.  Skin: Negative for rash.  Neurological: Negative for dizziness, weakness and numbness.  Hematological: Negative for adenopathy.  All other systems reviewed and are negative.    Physical Exam Updated Vital Signs BP 119/81 (BP Location: Right Arm)   Pulse 90   Temp 98 F (36.7 C)   Resp 17   Ht 6\' 3"  (1.905 m)   Wt 97.5 kg (215 lb)   SpO2 98%   BMI 26.87 kg/m   Physical Exam  Constitutional: He is oriented to person, place, and time. He appears well-developed and well-nourished. No distress.  HENT:  Head: Normocephalic and atraumatic.  Right Ear: Tympanic membrane and ear canal normal.  Left Ear: Tympanic membrane and ear canal normal.  Mouth/Throat: Uvula is midline, oropharynx is clear and moist and mucous membranes are normal. No oropharyngeal exudate.  Eyes: EOM are normal. Pupils are equal, round, and reactive to light.  Neck: Normal range of motion, full passive range of motion without pain and phonation normal. Neck supple.  Cardiovascular: Normal rate, regular rhythm, normal heart sounds and intact distal pulses.   No murmur heard. Pulmonary/Chest: Effort normal. No stridor. No respiratory distress. He has no rales. He exhibits no tenderness  (focal ttp of the lateral and posterior right ribs.  No bony deformity or crepitus ).  Mildly diminished lung sounds bilaterally.  No rales  Musculoskeletal: He exhibits no edema.  Lymphadenopathy:    He has no cervical adenopathy.  Neurological: He is alert and oriented to person, place, and time. He exhibits normal muscle tone. Coordination normal.  Skin: Skin is warm and dry. Capillary refill takes less than 2 seconds.  Nursing note and vitals reviewed.    ED Treatments / Results  Labs (all labs ordered are listed, but only abnormal results are displayed) Labs Reviewed - No data to display  EKG  EKG Interpretation None       Radiology Dg Chest 2 View  Result Date: 10/08/2016 CLINICAL DATA:  Right-sided chest pain. Nonproductive cough for 2 weeks. EXAM: CHEST  2 VIEW COMPARISON:  05/02/2016 FINDINGS: The heart size and mediastinal contours are within normal limits. Both lungs are clear. The visualized skeletal structures are unremarkable. IMPRESSION: No active cardiopulmonary disease. Electronically Signed   By: Kennith Center M.D.   On: 10/08/2016 15:17    Procedures Procedures (including critical care time)  Medications Ordered in ED Medications  albuterol (PROVENTIL HFA;VENTOLIN HFA) 108 (90 Base) MCG/ACT inhaler 2 puff (2 puffs Inhalation Given 10/08/16 1612)     Initial Impression / Assessment and Plan / ED Course  I have reviewed the triage vital signs and the nursing notes.  Pertinent labs & imaging results that were available during my care of the patient were reviewed by me and considered in my medical decision making (see chart for details).     Pt is well appearing, ambulates in the dept without respiratory distress.  Vitals stable.  Lung sounds improved after albuterol, no radiographic evidence of recurrent pneumo.  No tachycardia, hypoxia or tachypnea to suggest PE.  Pt appears stable for d/c, return precautions discussed.  Appears stable for d/c  Final  Clinical Impressions(s) / ED Diagnoses   Final diagnoses:  Cough    New Prescriptions Discharge Medication List as of 10/08/2016  4:17 PM    START taking these medications   Details  azithromycin (ZITHROMAX) 250 MG tablet Take first 2 tablets together, then 1 every day until finished., Print    predniSONE (DELTASONE) 20 MG tablet Take 2 tablets (40 mg total) by mouth daily. For 5 days, Starting Mon 10/08/2016, [employer], [place], PA-C 10/10/16 0935    Donnetta Hutching, MD 10/10/16 [phone]

## 2017-11-19 ENCOUNTER — Other Ambulatory Visit: Payer: Self-pay

## 2017-11-19 ENCOUNTER — Emergency Department (HOSPITAL_COMMUNITY): Payer: Medicare Other

## 2017-11-19 ENCOUNTER — Emergency Department (HOSPITAL_COMMUNITY)
Admission: EM | Admit: 2017-11-19 | Discharge: 2017-11-19 | Disposition: A | Discharge status: Home / Self Care | Payer: Medicare Other | Attending: Emergency Medicine | Admitting: Emergency Medicine

## 2017-11-19 ENCOUNTER — Encounter (HOSPITAL_COMMUNITY): Payer: Self-pay

## 2017-11-19 DIAGNOSIS — Z79899 Other long term (current) drug therapy: Secondary | ICD-10-CM | POA: Diagnosis not present

## 2017-11-19 DIAGNOSIS — G44309 Post-traumatic headache, unspecified, not intractable: Secondary | ICD-10-CM | POA: Diagnosis not present

## 2017-11-19 DIAGNOSIS — F17210 Nicotine dependence, cigarettes, uncomplicated: Secondary | ICD-10-CM | POA: Insufficient documentation

## 2017-11-19 DIAGNOSIS — G44329 Chronic post-traumatic headache, not intractable: Secondary | ICD-10-CM | POA: Diagnosis not present

## 2017-11-19 DIAGNOSIS — H53149 Visual discomfort, unspecified: Secondary | ICD-10-CM | POA: Diagnosis not present

## 2017-11-19 DIAGNOSIS — R55 Syncope and collapse: Secondary | ICD-10-CM | POA: Diagnosis not present

## 2017-11-19 DIAGNOSIS — R51 Headache: Secondary | ICD-10-CM | POA: Diagnosis not present

## 2017-11-19 DIAGNOSIS — R42 Dizziness and giddiness: Secondary | ICD-10-CM | POA: Diagnosis not present

## 2017-11-19 MED ORDER — METOCLOPRAMIDE HCL 5 MG/ML IJ SOLN
10.0000 mg | Freq: Once | INTRAMUSCULAR | Status: AC
  Administered 2017-11-19: 10 mg via INTRAVENOUS
  Filled 2017-11-19: qty 2

## 2017-11-19 MED ORDER — IBUPROFEN 800 MG PO TABS: 800.0000 mg | ORAL_TABLET | Freq: Three times a day (TID) | ORAL | 0 refills | Status: DC | PRN

## 2017-11-19 MED ORDER — KETOROLAC TROMETHAMINE 30 MG/ML IJ SOLN
30.0000 mg | Freq: Once | INTRAMUSCULAR | Status: AC
  Administered 2017-11-19: 30 mg via INTRAVENOUS
  Filled 2017-11-19: qty 1

## 2017-11-19 MED ORDER — SODIUM CHLORIDE 0.9 % IV BOLUS
1000.0000 mL | Freq: Once | INTRAVENOUS | Status: AC
  Administered 2017-11-19: 1000 mL via INTRAVENOUS

## 2017-11-19 NOTE — Discharge Instructions (Addendum)
Your evaluated in the emergency department for a chronic headache since a head injury.  You had a CAT scan that did not show any acute findings.  We are prescribing some ibuprofen to help with her headaches.  We are also giving the number for a neurologist in town as well as numbers for primary care doctors.  Please return if any concerns.
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
## 2017-11-19 NOTE — ED Triage Notes (Signed)
Pt has a headache that started 2 weeks ago. Has taken Goody powders, ibuprofen, and advil. States it starts in the back of his head and comes to the front underneath his eyes. States he also has some dizziness from it since it is so severe. Sensitive to light. States he has fallen in the last 2 days and hit the back of his truck

## 2018-06-16 DIAGNOSIS — H2013 Chronic iridocyclitis, bilateral: Secondary | ICD-10-CM | POA: Diagnosis not present

## 2018-07-01 ENCOUNTER — Other Ambulatory Visit: Payer: Self-pay

## 2018-07-01 ENCOUNTER — Encounter (HOSPITAL_COMMUNITY): Payer: Self-pay

## 2018-07-01 ENCOUNTER — Emergency Department (HOSPITAL_COMMUNITY): Payer: Medicare Other

## 2018-07-01 ENCOUNTER — Emergency Department (HOSPITAL_COMMUNITY)
Admission: EM | Admit: 2018-07-01 | Discharge: 2018-07-01 | Disposition: A | Discharge status: Home / Self Care | Payer: Medicare Other | Attending: Emergency Medicine | Admitting: Emergency Medicine

## 2018-07-01 DIAGNOSIS — F17210 Nicotine dependence, cigarettes, uncomplicated: Secondary | ICD-10-CM | POA: Diagnosis not present

## 2018-07-01 DIAGNOSIS — R0602 Shortness of breath: Secondary | ICD-10-CM | POA: Diagnosis not present

## 2018-07-01 DIAGNOSIS — R0789 Other chest pain: Secondary | ICD-10-CM | POA: Insufficient documentation

## 2018-07-01 DIAGNOSIS — R079 Chest pain, unspecified: Secondary | ICD-10-CM | POA: Diagnosis not present

## 2018-07-01 LAB — TROPONIN I: Troponin I: <0.03 ng/mL (ref <0.03)

## 2018-07-01 LAB — CBC
HCT: 45.9 % (ref 39.0–52.0)
Hemoglobin: 14.8 g/dL (ref 13.0–17.0)
MCH: 26.5 pg (ref 26.0–34.0)
MCHC: 32.2 g/dL (ref 30.0–36.0)
MCV: 82.3 fL (ref 80.0–100.0)
NRBC: 0 % (ref 0.0–0.2)
PLATELETS: 215 10*3/uL (ref 150–400)
RBC: 5.58 MIL/uL (ref 4.22–5.81)
RDW: 14.3 % (ref 11.5–15.5)
WBC: 7.4 10*3/uL (ref 4.0–10.5)

## 2018-07-01 LAB — BASIC METABOLIC PANEL
Anion gap: 9 (ref 5–15)
BUN: 14 mg/dL (ref 6–20)
CALCIUM: 8.9 mg/dL (ref 8.9–10.3)
CO2: 23 mmol/L (ref 22–32)
Chloride: 108 mmol/L (ref 98–111)
Creatinine, Ser: 0.86 mg/dL (ref 0.61–1.24)
GFR calc non Af Amer: >60 mL/min (ref >60)
Glucose, Bld: 104 mg/dL — ABNORMAL HIGH (ref 70–99)
Potassium: 3.8 mmol/L (ref 3.5–5.1)
Sodium: 140 mmol/L (ref 135–145)

## 2018-07-01 MED ORDER — NAPROXEN 250 MG PO TABS: 250.0000 mg | ORAL_TABLET | Freq: Two times a day (BID) | ORAL | 0 refills | Status: DC | PRN

## 2018-07-01 MED ORDER — IBUPROFEN 400 MG PO TABS
400.0000 mg | ORAL_TABLET | Freq: Once | ORAL | Status: AC
  Administered 2018-07-01: 400 mg via ORAL
  Filled 2018-07-01: qty 1

## 2018-07-01 MED ORDER — OXYCODONE-ACETAMINOPHEN 5-325 MG PO TABS: ORAL_TABLET | ORAL | 0 refills | Status: DC

## 2018-07-01 MED ORDER — OXYCODONE-ACETAMINOPHEN 5-325 MG PO TABS
1.0000 | ORAL_TABLET | Freq: Once | ORAL | Status: AC
  Administered 2018-07-01: 1 via ORAL
  Filled 2018-07-01: qty 1

## 2018-07-01 MED ORDER — METHOCARBAMOL 500 MG PO TABS: 1000.0000 mg | ORAL_TABLET | Freq: Four times a day (QID) | ORAL | 0 refills | Status: DC | PRN

## 2018-07-01 MED ORDER — SODIUM CHLORIDE 0.9% FLUSH
3.0000 mL | Freq: Once | INTRAVENOUS | Status: AC
  Administered 2018-07-01: 3 mL via INTRAVENOUS

## 2018-07-01 NOTE — ED Notes (Signed)
Returned from  X-ray

## 2018-07-01 NOTE — ED Notes (Signed)
Patient transported to X-ray 

## 2018-07-01 NOTE — ED Provider Notes (Signed)
[facility] EMERGENCY DEPARTMENT Provider Note   CSN: [id number] Arrival date & time: 07/01/18  1813    History   Chief Complaint Chief Complaint  Patient presents with   Chest Pain    HPI Richard Hampton is a 43 y.o. male.     HPI  Pt was seen at 1850. Per pt, c/o gradual onset and persistence of constant generalized chest "pain" that began 3 days ago. Pt states he woke up with symptoms. States the day before he mowed his lawn with a push mower. Pain worsens with palpation of the area and body position changes. Denies direct injury, no rash, no fevers, no abd pain, no N/V/D, no back pain, no palpitations, no SOB/cough.    Past Medical History:  Diagnosis Date   Collapsed lung    right side   Hyperlipidemia     Patient Active Problem List   Diagnosis Date Noted   H N P-LUMBAR 12/08/2008   LOW BACK PAIN 12/08/2008   SCIATICA 12/08/2008   UNSPECIFIED MONONEURITIS OF UPPER LIMB 06/04/2007    Past Surgical History:  Procedure Laterality Date   SPINE SURGERY          Home Medications    Prior to Admission medications   Not on File    Family History Family History  Problem Relation Age of Onset   Asthma Mother     Social History Social History   Tobacco Use   Smoking status: Current Every Day Smoker    Packs/day: 0.50    Types: Cigarettes   Smokeless tobacco: Current User    Types: Snuff  Substance Use Topics   Alcohol use: No   Drug use: No     Allergies   Penicillins   Review of Systems Review of Systems ROS: Statement: All systems negative except as marked or noted in the HPI; Constitutional: Negative for fever and chills. ; ; Eyes: Negative for eye pain, redness and discharge. ; ; ENMT: Negative for ear pain, hoarseness, nasal congestion, sinus pressure and sore throat. ; ; Cardiovascular: Negative for palpitations, diaphoresis, dyspnea and peripheral edema. ; ; Respiratory: Negative for cough, wheezing and stridor. ; ;  Gastrointestinal: Negative for nausea, vomiting, diarrhea, abdominal pain, blood in stool, hematemesis, jaundice and rectal bleeding. . ; ; Genitourinary: Negative for dysuria, flank pain and hematuria. ; ; Musculoskeletal: +chest wall pain. Negative for back pain and neck pain. Negative for swelling and direct trauma.; ; Skin: Negative for pruritus, rash, abrasions, blisters, bruising and skin lesion.; ; Neuro: Negative for headache, lightheadedness and neck stiffness. Negative for weakness, altered level of consciousness, altered mental status, extremity weakness, paresthesias, involuntary movement, seizure and syncope.       Physical Exam Updated Vital Signs BP 119/71    Pulse 89    Temp 97.8 F (36.6 C) (Oral)    Resp 13    Ht 6\' 3"  (1.905 m)    Wt 99.8 kg    SpO2 96%    BMI 27.50 kg/m   Physical Exam 1855: Physical examination:  Nursing notes reviewed; Vital signs and O2 SAT reviewed;  Constitutional: Well developed, Well nourished, Well hydrated, In no acute distress; Head:  Normocephalic, atraumatic; Eyes: EOMI, PERRL, No scleral icterus; ENMT: Mouth and pharynx normal, Mucous membranes moist; Neck: Supple, Full range of motion, No lymphadenopathy; Cardiovascular: Regular rate and rhythm, No gallop; Respiratory: Breath sounds clear & equal bilaterally, No wheezes.  Speaking full sentences with ease, Normal respiratory effort/excursion; Chest: +tender to palp  bilat parasternal areas and anterior chest wall. No rash, no deformity. Movement normal; Abdomen: Soft, Nontender, Nondistended, Normal bowel sounds; Genitourinary: No CVA tenderness; Extremities: Peripheral pulses normal, No tenderness, No edema, No calf edema or asymmetry.; Neuro: AA&Ox3, Major CN grossly intact.  Speech clear. No gross focal motor or sensory deficits in extremities.; Skin: Color normal, Warm, Dry.   ED Treatments / Results  Labs (all labs ordered are listed, but only abnormal results are displayed)   EKG EKG  Interpretation  Date/Time:  Tuesday July 01 2018 18:22:26 EDT Ventricular Rate:  99 PR Interval:    QRS Duration: 96 QT Interval:  334 QTC Calculation: 429 R Axis:   116 Text Interpretation:  Sinus rhythm Right axis deviation Baseline wander When compared with ECG of 05/02/2016 No significant change was found Confirmed by Samuel Jester [phone]) on 07/01/2018 7:19:41 PM   Radiology   Procedures Procedures (including critical care time)  Medications Ordered in ED Medications  sodium chloride flush (NS) 0.9 % injection 3 mL (3 mLs Intravenous Given 07/01/18 1840)  oxyCODONE-acetaminophen (PERCOCET/ROXICET) 5-325 MG per tablet 1 tablet (1 tablet Oral Given 07/01/18 1908)  ibuprofen (ADVIL,MOTRIN) tablet 400 mg (400 mg Oral Given 07/01/18 1908)     Initial Impression / Assessment and Plan / ED Course  I have reviewed the triage vital signs and the nursing notes.  Pertinent labs & imaging results that were available during my care of the patient were reviewed by me and considered in my medical decision making (see chart for details).       MDM Reviewed: previous chart, vitals and nursing note Reviewed previous: labs and ECG Interpretation: labs, ECG and x-ray    Results for orders placed or performed during the hospital encounter of 07/01/18  Basic metabolic panel  Result Value Ref Range   Sodium 140 135 - 145 mmol/L   Potassium 3.8 3.5 - 5.1 mmol/L   Chloride 108 98 - 111 mmol/L   CO2 23 22 - 32 mmol/L   Glucose, Bld 104 (H) 70 - 99 mg/dL   BUN 14 6 - 20 mg/dL   Creatinine, Ser 1.47 0.61 - 1.24 mg/dL   Calcium 8.9 8.9 - 09.2 mg/dL   GFR calc non Af Amer >60 >60 mL/min   GFR calc Af Amer >60 >60 mL/min   Anion gap 9 5 - 15  CBC  Result Value Ref Range   WBC 7.4 4.0 - 10.5 K/uL   RBC 5.58 4.22 - 5.81 MIL/uL   Hemoglobin 14.8 13.0 - 17.0 g/dL   HCT 95.7 47.3 - 40.3 %   MCV 82.3 80.0 - 100.0 fL   MCH 26.5 26.0 - 34.0 pg   MCHC 32.2 30.0 - 36.0 g/dL   RDW 70.9  64.3 - 83.8 %   Platelets 215 150 - 400 K/uL   nRBC 0.0 0.0 - 0.2 %  Troponin I - ONCE - STAT  Result Value Ref Range   Troponin I <0.03 <0.03 ng/mL   Dg Chest 2 View  Result Date: 07/01/2018 CLINICAL DATA:  Chest pain, shortness of breath. EXAM: CHEST - 2 VIEW COMPARISON:  Radiographs of October 08, 2016 FINDINGS: The heart size and mediastinal contours are within normal limits. Both lungs are clear. No pneumothorax or pleural effusion is noted. The visualized skeletal structures are unremarkable. IMPRESSION: No active cardiopulmonary disease. Electronically Signed   By: Lupita Raider, M.D.   On: 07/01/2018 19:25    1935:  Doubt PE as cause for  symptoms with PERC negative.  Doubt ACS as cause for symptoms with normal troponin and unchanged EKG from previous after 3 days of constant atypical symptoms. Feels better after pain meds and wants to go home now.  Dx and testing d/w pt.  Questions answered.  Verb understanding, agreeable to d/c home with outpt f/u.     Final Clinical Impressions(s) / ED Diagnoses   Final diagnoses:  None    ED Discharge Orders    None       Samuel Jester, DO 07/05/18 1237

## 2018-07-01 NOTE — ED Triage Notes (Signed)
Pt presents to ED with complaints of bilateral chest pain which started on Saturday, SOB. Pt denies N/V, diaphoresis. Pt states hurts more on the left than the right side. Pt also has a productive cough.

## 2018-07-01 NOTE — Discharge Instructions (Signed)
Take the prescriptions as directed.  Apply moist heat or ice to the area(s) of discomfort, for 15 minutes at a time, several times per day for the next few days.  Do not fall asleep on a heating or ice pack.  Call your regular medical doctor tomorrow to schedule a follow up appointment this week.  Return to the Emergency Department immediately if worsening. ° °

## 2018-09-19 DIAGNOSIS — Z713 Dietary counseling and surveillance: Secondary | ICD-10-CM | POA: Diagnosis not present

## 2018-09-19 DIAGNOSIS — F17210 Nicotine dependence, cigarettes, uncomplicated: Secondary | ICD-10-CM | POA: Diagnosis not present

## 2018-09-19 DIAGNOSIS — M549 Dorsalgia, unspecified: Secondary | ICD-10-CM | POA: Diagnosis not present

## 2018-09-19 DIAGNOSIS — Z299 Encounter for prophylactic measures, unspecified: Secondary | ICD-10-CM | POA: Diagnosis not present

## 2018-09-19 DIAGNOSIS — Z6825 Body mass index (BMI) 25.0-25.9, adult: Secondary | ICD-10-CM | POA: Diagnosis not present

## 2018-10-31 DIAGNOSIS — Z79899 Other long term (current) drug therapy: Secondary | ICD-10-CM | POA: Diagnosis not present

## 2018-10-31 DIAGNOSIS — F17210 Nicotine dependence, cigarettes, uncomplicated: Secondary | ICD-10-CM | POA: Diagnosis not present

## 2018-10-31 DIAGNOSIS — F329 Major depressive disorder, single episode, unspecified: Secondary | ICD-10-CM | POA: Diagnosis not present

## 2018-10-31 DIAGNOSIS — Z6826 Body mass index (BMI) 26.0-26.9, adult: Secondary | ICD-10-CM | POA: Diagnosis not present

## 2018-10-31 DIAGNOSIS — Z7189 Other specified counseling: Secondary | ICD-10-CM | POA: Diagnosis not present

## 2018-10-31 DIAGNOSIS — Z0000 Encounter for general adult medical examination without abnormal findings: Secondary | ICD-10-CM | POA: Diagnosis not present

## 2018-10-31 DIAGNOSIS — R5383 Other fatigue: Secondary | ICD-10-CM | POA: Diagnosis not present

## 2018-10-31 DIAGNOSIS — Z299 Encounter for prophylactic measures, unspecified: Secondary | ICD-10-CM | POA: Diagnosis not present

## 2018-10-31 DIAGNOSIS — Z1331 Encounter for screening for depression: Secondary | ICD-10-CM | POA: Diagnosis not present

## 2018-10-31 DIAGNOSIS — Z1339 Encounter for screening examination for other mental health and behavioral disorders: Secondary | ICD-10-CM | POA: Diagnosis not present

## 2019-02-15 ENCOUNTER — Other Ambulatory Visit: Payer: Self-pay

## 2019-02-15 ENCOUNTER — Emergency Department (HOSPITAL_COMMUNITY)
Admission: EM | Admit: 2019-02-15 | Discharge: 2019-02-15 | Disposition: A | Discharge status: Home / Self Care | Payer: Medicare Other | Attending: Emergency Medicine | Admitting: Emergency Medicine

## 2019-02-15 ENCOUNTER — Encounter (HOSPITAL_COMMUNITY): Payer: Self-pay | Admitting: *Deleted

## 2019-02-15 ENCOUNTER — Emergency Department (HOSPITAL_COMMUNITY): Payer: Medicare Other

## 2019-02-15 DIAGNOSIS — M79601 Pain in right arm: Secondary | ICD-10-CM | POA: Diagnosis not present

## 2019-02-15 DIAGNOSIS — M542 Cervicalgia: Secondary | ICD-10-CM | POA: Insufficient documentation

## 2019-02-15 DIAGNOSIS — F17210 Nicotine dependence, cigarettes, uncomplicated: Secondary | ICD-10-CM | POA: Insufficient documentation

## 2019-02-15 DIAGNOSIS — R202 Paresthesia of skin: Secondary | ICD-10-CM

## 2019-02-15 MED ORDER — OXYCODONE-ACETAMINOPHEN 5-325 MG PO TABS: 1.0000 | ORAL_TABLET | Freq: Four times a day (QID) | ORAL | 0 refills | Status: DC | PRN

## 2019-02-15 MED ORDER — OXYCODONE-ACETAMINOPHEN 5-325 MG PO TABS
1.0000 | ORAL_TABLET | Freq: Once | ORAL | Status: AC
  Administered 2019-02-15: 19:00:00 1 via ORAL
  Filled 2019-02-15: qty 1

## 2019-02-15 MED ORDER — PREDNISONE 50 MG PO TABS
60.0000 mg | ORAL_TABLET | Freq: Once | ORAL | Status: AC
  Administered 2019-02-15: 60 mg via ORAL
  Filled 2019-02-15: qty 1

## 2019-02-15 MED ORDER — PREDNISONE 10 MG (21) PO TBPK: ORAL_TABLET | Freq: Every day | ORAL | 0 refills | Status: DC

## 2019-02-15 MED ORDER — OXYCODONE-ACETAMINOPHEN 5-325 MG PO TABS
1.0000 | ORAL_TABLET | Freq: Once | ORAL | Status: AC
  Administered 2019-02-15: 20:00:00 1 via ORAL
  Filled 2019-02-15: qty 1

## 2019-02-15 NOTE — Discharge Instructions (Addendum)
You were seen in the emergency department today for neck pain with right arm numbness.  Your cervical spine CT did not show any substantial new abnormalities.  We have spoken with neurosurgeon Dr. Ellene Route, he recommends that you have an MRI done, we have ordered this, our scheduler will call you to set this up for tomorrow, the phone number for the scheduler is [phone] should for some reason you not hear from them.  In the meantime we are sending you home with Percocet to take for severe pain as well as a steroid pack.  -Percocet-this is a narcotic/controlled substance medication that has potential addicting qualities.  We recommend that you take 1-2 tablets every 6 hours as needed for severe pain.  Do not drive or operate heavy machinery when taking this medicine as it can be sedating. Do not drink alcohol or take other sedating medications when taking this medicine for safety reasons.  Keep this out of reach of small children.  Please be aware this medicine has Tylenol in it (325 mg/tab) do not exceed the maximum dose of Tylenol in a day per over the counter recommendations should you decide to supplement with Tylenol over the counter.   -Prednisone: This is a steroid, anti-inflammatory, please take this in the morning with food.  This is to help with inflammation.  We have prescribed you new medication(s) today. Discuss the medications prescribed today with your pharmacist as they can have adverse effects and interactions with your other medicines including over the counter and prescribed medications. Seek medical evaluation if you start to experience new or abnormal symptoms after taking one of these medicines, seek care immediately if you start to experience difficulty breathing, feeling of your throat closing, facial swelling, or rash as these could be indications of a more serious allergic reaction  Please come back for your MRI and follow-up with Dr. Clarice Pole office within the next 3 days.   Return to the ER sooner for new or worsening symptoms including but not limited to worsening pain, weakness throughout the right upper extremity numbness or weakness in other areas of your body, trouble with speech, facial droop, fever, or any other concerns.

## 2019-02-15 NOTE — ED Triage Notes (Signed)
Pt with right arm numbness since 1400 with sharp pain to right shoulder.  Pt unable to grip with right hand. Denies any numbness anywhere else.  Pt denies any injury or pinch nerves.

## 2019-02-15 NOTE — ED Notes (Signed)
Report received  From CT

## 2019-02-15 NOTE — ED Provider Notes (Signed)
[facility] EMERGENCY DEPARTMENT Provider Note   CSN: [id number] Arrival date & time: 02/15/19  1702     History   Chief Complaint Chief Complaint  Patient presents with   Numbness    HPI Richard Hampton is a 43 y.o. male with a hx of tobacco abuse, hyperlipidemia & prior lumbar spine surgery who presents to the ED with complaints of neck pain & RUE paresthesias that began @ 1400 this afternoon. Patient states that he reached for his phone and dropped it as he felt he could not feel it in his R hand, he then started to notice paresthesias with pain to the entire RUE that seemed to be originating from the neck. Pain has been constant since onset, paresthesias have been intermittent, sxs worse with RUE movement, no alleviating factors. No intervention PTA. No specific injury/heavy lifting/change in activity that seemed to trigger sxs. Sxs feel somewhat similar to what he had in his back/LE pain that lead to surgery by Dr. Hilda Lias in 2011. Denies  saddle anesthesia, incontinence to bowel/bladder, fever, chills, IV drug use, dysuria, or hx of cancer. He is not having any slurred speech, headache, dizziness, facial droop, LUE or lower extremity weakness/numbness.      HPI  Past Medical History:  Diagnosis Date   Collapsed lung    right side   Hyperlipidemia     Patient Active Problem List   Diagnosis Date Noted   H N P-LUMBAR 12/08/2008   LOW BACK PAIN 12/08/2008   SCIATICA 12/08/2008   UNSPECIFIED MONONEURITIS OF UPPER LIMB 06/04/2007    Past Surgical History:  Procedure Laterality Date   SPINE SURGERY          Home Medications    Prior to Admission medications   Medication Sig Start Date End Date Taking? Authorizing Provider  methocarbamol (ROBAXIN) 500 MG tablet Take 2 tablets (1,000 mg total) by mouth 4 (four) times daily as needed for muscle spasms (muscle spasm/pain). 07/01/18   Samuel Jester, DO  naproxen (NAPROSYN) 250 MG tablet Take 1 tablet  (250 mg total) by mouth 2 (two) times daily as needed for mild pain or moderate pain (take with food). 07/01/18   Samuel Jester, DO  oxyCODONE-acetaminophen (PERCOCET/ROXICET) 5-325 MG tablet 1 or 2 tabs PO q8h prn pain 07/01/18   Samuel Jester, DO    Family History Family History  Problem Relation Age of Onset   Asthma Mother     Social History Social History   Tobacco Use   Smoking status: Current Every Day Smoker    Packs/day: 0.50    Types: Cigarettes   Smokeless tobacco: Former Neurosurgeon    Types: Snuff  Substance Use Topics   Alcohol use: No   Drug use: No     Allergies   Penicillins   Review of Systems Review of Systems  Constitutional: Negative for chills, fever and unexpected weight change.  Gastrointestinal: Negative for abdominal pain, nausea and vomiting.  Genitourinary: Negative for dysuria.  Musculoskeletal: Positive for neck pain.  Neurological: Positive for numbness (RUE). Negative for dizziness, seizures, syncope, facial asymmetry, speech difficulty, weakness and headaches.       Negative for saddle anesthesia or bowel/bladder incontinence.   All other systems reviewed and are negative.    Physical Exam Updated Vital Signs BP 131/84 (BP Location: Right Arm)    Pulse 76    Temp 97.8 F (36.6 C) (Oral)    Ht 6\' 3"  (1.905 m)    Wt 102.1 kg  SpO2 99%    BMI 28.12 kg/m   Physical Exam Vitals signs and nursing note reviewed.  Constitutional:      General: He is not in acute distress.    Appearance: Normal appearance. He is not ill-appearing or toxic-appearing.  HENT:     Head: Normocephalic and atraumatic.  Eyes:     Extraocular Movements: Extraocular movements intact.     Conjunctiva/sclera: Conjunctivae normal.     Pupils: Pupils are equal, round, and reactive to light.  Neck:     Musculoskeletal: Neck supple. Spinous process tenderness (diffuse midline) and muscular tenderness (right sided) present.  Cardiovascular:     Rate and  Rhythm: Normal rate and regular rhythm.     Pulses:          Radial pulses are 2+ on the right side and 2+ on the left side.  Pulmonary:     Effort: Pulmonary effort is normal. No respiratory distress.     Breath sounds: Normal breath sounds. No wheezing, rhonchi or rales.  Musculoskeletal:     Comments: Upper extremities: No obvious deformity, appreciable swelling, edema, erythema, ecchymosis, warmth, or open wounds. Patient has intact AROM throughout the LUE. RUE with intact AROM to the R wrist & digits. R elbow with some limitation in flexion- able to fully extend, able to flex to about 90 degrees, R shoulder flexion/abduction limited- able to perform each of these motions actively to about 90 degrees. Tender to palpation to the diffuse RUE without point/focal bony tenderness. Compartments are soft. No overlying erythema/warmth.   Skin:    General: Skin is warm and dry.     Capillary Refill: Capillary refill takes less than 2 seconds.  Neurological:     Mental Status: He is alert.     Comments: Alert. Clear speech.  CN III through XII grossly intact.  Sensation grossly intact to the LUE & bilateral LEs. RUE with decreased sensation, unable to discriminate sharp/dull in the RUE, able to do so in other extremities. 5/5 symmetric grip strength & strength with wrist/elbow/shoulder flexion/extension. 5/5 symmetric strength with ankle plantar/dorsiflexion.   Psychiatric:        Mood and Affect: Mood normal.        Behavior: Behavior normal.    ED Treatments / Results  Labs (all labs ordered are listed, but only abnormal results are displayed) Labs Reviewed - No data to display  EKG None  Radiology Ct Cervical Spine Wo Contrast  Result Date: 02/15/2019 CLINICAL DATA:  Neck pain EXAM: CT CERVICAL SPINE WITHOUT CONTRAST TECHNIQUE: Multidetector CT imaging of the cervical spine was performed without intravenous contrast. Multiplanar CT image reconstructions were also generated. COMPARISON:   MRI 07/17/2007 FINDINGS: Alignment: No subluxation Skull base and vertebrae: No acute fracture. No primary bone lesion or focal pathologic process. Soft tissues and spinal canal: No prevertebral fluid or swelling. No visible canal hematoma. Disc levels:  Maintained Upper chest: No acute findings Other: None IMPRESSION: No acute bony abnormality. Electronically Signed   By: Rolm Baptise M.D.   On: 02/15/2019 19:20    Procedures Procedures (including critical care time)  Medications Ordered in ED Medications  oxyCODONE-acetaminophen (PERCOCET/ROXICET) 5-325 MG per tablet 1 tablet (1 tablet Oral Given 02/15/19 1903)  oxyCODONE-acetaminophen (PERCOCET/ROXICET) 5-325 MG per tablet 1 tablet (1 tablet Oral Given 02/15/19 2023)  predniSONE (DELTASONE) tablet 60 mg (60 mg Oral Given 02/15/19 2111)     Initial Impression / Assessment and Plan / ED Course  I have reviewed the triage  vital signs and the nursing notes.  Pertinent labs & imaging results that were available during my care of the patient were reviewed by me and considered in my medical decision making (see chart for details).   Patient presents to the emergency department with right upper extremity paresthesias and discomfort which he states feel as though they are originating from the neck & began at 1400 this evening.  Patient is nontoxic-appearing, in no apparent distress, vitals WNL.  Symptoms are localized to the right upper extremity, he has no cranial nerve, left upper extremity, or lower extremity deficits to indicate acute CVA.  He has no fever, overlying erythema/warmth, or history of IVDU to suggest infectious process such as epidural abscess, cellulitis, osteomyelitis, or septic joint.  His pain is reproducible with midline cervical spine and diffuse right upper extremity palpation.  He does have decreased sensation throughout the right upper extremity and is unable to discriminate sharp versus dull touch.  He does have good strength  throughout.  Plan for analgesics, CT cervical spine, and discussion with neurosurgery.   CT cervical spine without acute abnormalities.  On reevaluation patient is feeling much better after oxycodone, he has full range of motion throughout the right upper extremity now with some pain relief and improvement in sensation.  20:55: CONSULT: Discussed patient presentation and exam as well as CT imaging with neurosurgeon Dr. Danielle DessElsner, he has reviewed CT imaging, has provided recommendation that patient does not need emergent MRI tonight but would benefit from being scheduled for one tomorrow and follow up in clinic. In agreement with analgesics and a 6 day steroid taper. Appreciate consultation.   I discussed results, treatment plan, need for follow-up, and return precautions with the patient. Provided opportunity for questions, patient confirmed understanding and is in agreement with plan.   Findings and plan of care discussed with supervising physician Dr. Estell HarpinZammit who is in agreement.    Final Clinical Impressions(s) / ED Diagnoses   Final diagnoses:  Neck pain  Paresthesia    ED Discharge Orders         Ordered    oxyCODONE-acetaminophen (PERCOCET/ROXICET) 5-325 MG tablet  Every 6 hours PRN     02/15/19 2105    predniSONE (STERAPRED UNI-PAK 21 TAB) 10 MG (21) TBPK tablet  Daily     02/15/19 2105    MR CERVICAL SPINE WO CONTRAST     02/15/19 2113           Desmond Lopeetrucelli, Paighton Godette R, PA-C 02/15/19 2118    Bethann BerkshireZammit, Joseph, MD 02/16/19 1513

## 2019-02-15 NOTE — ED Notes (Signed)
Awaiting re-eval and dispo 

## 2019-02-16 DIAGNOSIS — Z713 Dietary counseling and surveillance: Secondary | ICD-10-CM | POA: Diagnosis not present

## 2019-02-16 DIAGNOSIS — F17210 Nicotine dependence, cigarettes, uncomplicated: Secondary | ICD-10-CM | POA: Diagnosis not present

## 2019-02-16 DIAGNOSIS — M79601 Pain in right arm: Secondary | ICD-10-CM | POA: Diagnosis not present

## 2019-02-16 DIAGNOSIS — Z299 Encounter for prophylactic measures, unspecified: Secondary | ICD-10-CM | POA: Diagnosis not present

## 2019-02-16 DIAGNOSIS — Z6827 Body mass index (BMI) 27.0-27.9, adult: Secondary | ICD-10-CM | POA: Diagnosis not present

## 2019-02-16 MED FILL — Oxycodone w/ Acetaminophen Tab 5-325 MG: ORAL | Qty: 6 | Status: AC

## 2019-04-08 ENCOUNTER — Encounter (HOSPITAL_COMMUNITY): Payer: Self-pay | Admitting: Physical Therapy

## 2019-04-08 ENCOUNTER — Other Ambulatory Visit: Payer: Self-pay

## 2019-04-08 ENCOUNTER — Ambulatory Visit (HOSPITAL_COMMUNITY): Payer: Medicare Other | Attending: Internal Medicine | Admitting: Physical Therapy

## 2019-04-08 DIAGNOSIS — M542 Cervicalgia: Secondary | ICD-10-CM

## 2019-04-08 DIAGNOSIS — R29898 Other symptoms and signs involving the musculoskeletal system: Secondary | ICD-10-CM | POA: Diagnosis not present

## 2019-04-08 DIAGNOSIS — R293 Abnormal posture: Secondary | ICD-10-CM | POA: Diagnosis not present

## 2019-04-08 DIAGNOSIS — M6281 Muscle weakness (generalized): Secondary | ICD-10-CM | POA: Diagnosis not present

## 2019-04-08 NOTE — Therapy (Signed)
[facility] [address] [place], [place], [address] Phone: [phone]   Fax:  [phone]  Physical Therapy Evaluation  Patient Details  Name: Richard Hampton MRN: [id number] Date of Birth: 1975-12-30 Referring Provider (PT): Kirstie Peri MD   Encounter Date: 04/08/2019  PT End of Session - 04/08/19 1255    Visit Number  1    Number of Visits  12    Date for PT Re-Evaluation  05/20/19    Authorization Type  Medicare part A and B    Authorization Time Period  04/08/19-05/20/19 (complete PN on 10th visit)    Authorization - Visit Number  1    Authorization - Number of Visits  10    PT Start Time  1117    PT Stop Time  1200    PT Time Calculation (min)  43 min    Activity Tolerance  Patient tolerated treatment well    Behavior During Therapy  [facility] for tasks assessed/performed       Past Medical History:  Diagnosis Date  . Collapsed lung    right side  . Hyperlipidemia     Past Surgical History:  Procedure Laterality Date  . SPINE SURGERY      There were no vitals filed for this visit.   Subjective Assessment - 04/08/19 1117    Subjective  Patient is a 44 y.o male who presents to physical therapy with c/o neck and R UE pain and numbness. His symptoms extend into his hand on the front and back sides. This began about 3 months ago and are intermittent but last for an extended period of time. He has had difficulty with gripping and using RUE with chores/tasks which is limiting his ability to work. He notices popping in his shoulder. He has not determined what makes it worse. Hot water tends to make it better. His main goal for therapy is to make his arm better.    Limitations  Sitting;Standing;Lifting;House hold activities    Patient Stated Goals  to make his arm better    Currently in Pain?  Yes    Pain Score  5     Pain Location  Other (Comment)   neck radiating into shoulder        [facility] PT Assessment - 04/08/19 0001      Assessment   Medical Diagnosis  Neck Pain    Referring Provider (PT)  Kirstie Peri MD    Onset Date/Surgical Date  01/06/19    Hand Dominance  Right    Next MD Visit  Feb. 8    Prior Therapy  For low back      Precautions   Precautions  None      Restrictions   Weight Bearing Restrictions  No      Balance Screen   Has the patient fallen in the past 6 months  No    Has the patient had a decrease in activity level because of a fear of falling?   No    Is the patient reluctant to leave their home because of a fear of falling?   No      Prior Function   Level of Independence  Independent    Vocation  Full time employment;Self employed    Vocation Requirements  Landscaping      Cognition   Overall Cognitive Status  Within Functional Limits for tasks assessed    Attention  Focused      Observation/Other Assessments  Observations  Patient sits with slightly slouched posture with forward head and rounded shoulders.     Focus on Therapeutic Outcomes (FOTO)   37% limited      Sensation   Additional Comments  unable to assess due to time constraints      ROM / Strength   AROM / PROM / Strength  AROM;Strength      AROM   Overall AROM   Deficits;Due to pain    AROM Assessment Site  Cervical    Cervical Flexion  25 % limited *pain, radiates to elbow    Cervical Extension  25% limited * pain    Cervical - Right Side Bend  50% pulling on L in UT    Cervical - Left Side Bend  0 % limited    Cervical - Right Rotation  0% limited *pain, radiating into shoulder    Cervical - Left Rotation  0% limited      Strength   Overall Strength  Deficits   on R   Overall Strength Comments  L stronger than RUE    Strength Assessment Site  Shoulder;Elbow;Wrist;Hand    Right/Left Shoulder  Right;Left    Right Shoulder Flexion  4-/5    Right Shoulder ABduction  4+/5    Left Shoulder Flexion  5/5    Left Shoulder Extension  5/5    Right/Left Elbow  Right;Left    Right Elbow Flexion  4+/5     Right Elbow Extension  4-/5    Left Elbow Flexion  5/5    Left Elbow Extension  5/5    Right/Left Wrist  Right;Left    Right Wrist Flexion  5/5    Right Wrist Extension  5/5    Left Wrist Flexion  5/5    Left Wrist Extension  5/5    Right/Left hand  Right;Left    Right Hand Gross Grasp  Functional    Left Hand Gross Grasp  Functional      Palpation   Palpation comment  R first rib - painful, hypomobile, pain into RUE - concordant*. hyperactive tender UT bilateral.  Further assess patient cervical spine next session along with thoracic/rib mobility                Objective measurements completed on examination: See above findings.              PT Education - 04/08/19 1359    Education Details  Patient educated on exam findings, POC, possible contribution to symptoms, scope of PT    Person(s) Educated  Patient    Methods  Explanation    Comprehension  Verbalized understanding       PT Short Term Goals - 04/08/19 1420      PT SHORT TERM GOAL #1   Title  Patient will be independent with HEP in order to improve functional outcomes.    Time  3    Period  Weeks    Status  New    Target Date  04/29/19      PT SHORT TERM GOAL #2   Title  Patient will report at least 25% improvement in symptoms in order to improve quality of life.    Time  3    Period  Weeks    Status  New    Target Date  04/29/19        PT Long Term Goals - 04/08/19 1421      PT LONG TERM GOAL #1   Title  Patient will improve FOTO score by at least 6% in order to demonstrate improved tolerance to activity.    Time  6    Period  Weeks    Status  New    Target Date  05/20/19      PT LONG TERM GOAL #2   Title  Patient will report at least 75% improvement in symptoms in order to improve quality of life.    Time  6    Period  Weeks    Status  New    Target Date  05/20/19      PT LONG TERM GOAL #3   Title  Patient will improve cervical spine ROM by at least 25% in all planes in  order to look in all directions easily while working.    Time  6    Period  Weeks    Status  New    Target Date  05/20/19      PT LONG TERM GOAL #4   Title  Patient will report centeralized symptoms no greater than 2/10 for at least 1 week.    Time  6    Period  Weeks    Status  New    Target Date  05/20/19             Plan - 04/08/19 1400    Clinical Impression Statement  Patient is a 44 y.o male who presents to physical therapy with c/o neck and R UE pain and numbness. Patient's symptoms appear to be consistent with cervical radiculopathy or thoracic outlet syndrome and further assessment needs to be completed to determine. He presents with pain limited deficits in cervical and UE strength, ROM, hypomobility, endurance, posture, hyperactive cervical/trunk muscles, and functional mobility with ADL. He is having to modify and restrict ADL as indicated by FOTO score as well as subjective information and objective measures which is affecting overall participation. Symptoms are limiting patient's ability to work and participate in recreation/ADLs. Patient will benefit from skilled physical therapy in order to improve function and reduce impairment.    Personal Factors and Comorbidities  Past/Current Experience;Education;Profession    Examination-Activity Limitations  Bend;Lift;Reach Overhead;Caring for Others    Examination-Participation Restrictions  Yard Work;Cleaning;Shop;Volunteer;Community Activity;Driving    Stability/Clinical Decision Making  Evolving/Moderate complexity    Clinical Decision Making  Moderate    Rehab Potential  Fair    PT Frequency  2x / week    PT Duration  6 weeks    PT Treatment/Interventions  ADLs/Self Care Home Management;Aquatic Therapy;Biofeedback;Cryotherapy;Electrical Stimulation;Iontophoresis 4mg /ml Dexamethasone;Moist Heat;Traction;Ultrasound;DME Instruction;Gait training;Stair training;Functional mobility training;Therapeutic activities;Therapeutic  exercise;Balance training;Neuromuscular re-education;Patient/family education;Manual techniques;Compression bandaging;Scar mobilization;Passive range of motion;Dry needling;Energy conservation;Splinting;Taping;Joint Manipulations;Spinal Manipulations    PT Next Visit Plan  Initiate HEP, Assess first rib, cervical and thoracic mobility, assess c/sp retraction, assess paraspinals, periscapular and chest musculature for contribution to symptoms. Possibly add manual therapy, begin postural strengthening    PT Home Exercise Plan  initiate next session    Consulted and Agree with Plan of Care  Patient       Patient will benefit from skilled therapeutic intervention in order to improve the following deficits and impairments:  Decreased activity tolerance, Decreased endurance, Decreased mobility, Decreased range of motion, Decreased strength, Hypomobility, Increased muscle spasms, Impaired tone, Improper body mechanics, Postural dysfunction, Pain  Visit Diagnosis: Cervicalgia  Other symptoms and signs involving the musculoskeletal system  Abnormal posture  Muscle weakness (generalized)     Problem List Patient Active Problem List   Diagnosis  Date Noted  . H N P-LUMBAR 12/08/2008  . LOW BACK PAIN 12/08/2008  . SCIATICA 12/08/2008  . UNSPECIFIED MONONEURITIS OF UPPER LIMB 06/04/2007    2:30 PM, 04/08/19 Wyman Songster PT, DPT Physical Therapist at [facility]   [facility] [address] [place], [place], [address] Phone: [phone]   Fax:  [phone]  Name: ROYSTON BEKELE MRN: [id number] Date of Birth: [birth year]

## 2019-04-15 ENCOUNTER — Ambulatory Visit (HOSPITAL_COMMUNITY): Payer: Medicare Other | Admitting: Physical Therapy

## 2019-04-15 ENCOUNTER — Other Ambulatory Visit: Payer: Self-pay

## 2019-04-15 ENCOUNTER — Encounter (HOSPITAL_COMMUNITY): Payer: Self-pay | Admitting: Physical Therapy

## 2019-04-15 DIAGNOSIS — M6281 Muscle weakness (generalized): Secondary | ICD-10-CM

## 2019-04-15 DIAGNOSIS — R29898 Other symptoms and signs involving the musculoskeletal system: Secondary | ICD-10-CM | POA: Diagnosis not present

## 2019-04-15 DIAGNOSIS — M542 Cervicalgia: Secondary | ICD-10-CM | POA: Diagnosis not present

## 2019-04-15 DIAGNOSIS — R293 Abnormal posture: Secondary | ICD-10-CM | POA: Diagnosis not present

## 2019-04-15 NOTE — Therapy (Signed)
[facility] [address] [place], [place], [address] Phone: [phone]   Fax:  [phone]  Physical Therapy Treatment  Patient Details  Name: Richard Hampton MRN: [id number] Date of Birth: Jan 08, 1976 Referring Provider (PT): Kirstie Peri MD   Encounter Date: 04/15/2019  PT End of Session - 04/15/19 1105    Visit Number  2    Number of Visits  12    Date for PT Re-Evaluation  05/20/19    Authorization Type  Medicare part A and B    Authorization Time Period  04/08/19-05/20/19 (complete PN on 10th visit)    Authorization - Visit Number  3    Authorization - Number of Visits  10    PT Start Time  1033    PT Stop Time  1115    PT Time Calculation (min)  42 min    Activity Tolerance  Patient tolerated treatment well    Behavior During Therapy  [facility] for tasks assessed/performed       Past Medical History:  Diagnosis Date  . Collapsed lung    right side  . Hyperlipidemia     Past Surgical History:  Procedure Laterality Date  . SPINE SURGERY      There were no vitals filed for this visit.  Subjective Assessment - 04/15/19 1034    Subjective  Patient reports he is about the same. Nothing new today with no other complaints. Symptoms continue to go into R UE. His arm gave out on him the other day again.    Limitations  Sitting;Standing;Lifting;House hold activities    Patient Stated Goals  to make his arm better    Currently in Pain?  Yes    Pain Score  3     Pain Location  Shoulder         [facility] PT Assessment - 04/15/19 0001      Palpation   Palpation comment  R first rib - painful, hypomobile, pain into RUE - concordant*. hyperactive tender UT bilateral.  Further assess patient cervical spine next session along with thoracic/rib mobility                   [facility] Adult PT Treatment/Exercise - 04/15/19 0001      Exercises   Exercises  Neck      Neck Exercises: Standing   Other Standing Exercises  scapular  retractions with posterior support 3x10 with 4 second holds      Neck Exercises: Seated   Other Seated Exercise  scalenes stretch in seated 3x30 second holds on R    Other Seated Exercise  t/sp extension over chair 5x5 second holds      Manual Therapy   Manual Therapy  Joint mobilization;Soft tissue mobilization    Manual therapy comments  completed seperately from all other aspects of treatment     Joint Mobilization  First rib inferior glides grade II-III     Soft tissue mobilization  R scalenes, UT             PT Education - 04/15/19 1105    Education Details  Patient educated on HEP, posture, anatomy, possible first rib contribution to symptoms    Person(s) Educated  Patient    Methods  Explanation;Handout    Comprehension  Verbalized understanding       PT Short Term Goals - 04/15/19 1303      PT SHORT TERM GOAL #1   Title  Patient will be independent  with HEP in order to improve functional outcomes.    Time  3    Period  Weeks    Status  On-going    Target Date  04/29/19      PT SHORT TERM GOAL #2   Title  Patient will report at least 25% improvement in symptoms in order to improve quality of life.    Time  3    Period  Weeks    Status  On-going    Target Date  04/29/19        PT Long Term Goals - 04/15/19 1303      PT LONG TERM GOAL #1   Title  Patient will improve FOTO score by at least 6% in order to demonstrate improved tolerance to activity.    Time  6    Period  Weeks    Status  On-going      PT LONG TERM GOAL #2   Title  Patient will report at least 75% improvement in symptoms in order to improve quality of life.    Time  6    Period  Weeks    Status  On-going      PT LONG TERM GOAL #3   Title  Patient will improve cervical spine ROM by at least 25% in all planes in order to look in all directions easily while working.    Time  6    Period  Weeks    Status  On-going      PT LONG TERM GOAL #4   Title  Patient will report centeralized  symptoms no greater than 2/10 for at least 1 week.    Time  6    Period  Weeks    Status  On-going            Plan - 04/15/19 1106    Clinical Impression Statement  Patient has a hypomobile first rib and shortened R scalenes which may be contributing to R UE symptoms. He tolerates inferior/medial glides on first rib well with symptom provocation into RUE with mobilization which decreases following manual therapy. He requires min/mod verbal cueing for appropriate intensity of stretches/exercises and is educated on how to complete at home. Patient posture and occupation are likely contributing to symptoms and patient will continue to benefit from improved cervical, thoracic, and rib mobility as well as postural strengthening. Patient will continue to benefit from skilled physical therapy in order to improve function and reduce impairment.    Personal Factors and Comorbidities  Past/Current Experience;Education;Profession    Examination-Activity Limitations  Bend;Lift;Reach Overhead;Caring for Others    Examination-Participation Restrictions  Yard Work;Cleaning;Shop;Volunteer;Community Activity;Driving    Stability/Clinical Decision Making  Evolving/Moderate complexity    Rehab Potential  Fair    PT Frequency  2x / week    PT Duration  6 weeks    PT Treatment/Interventions  ADLs/Self Care Home Management;Aquatic Therapy;Biofeedback;Cryotherapy;Electrical Stimulation;Iontophoresis 4mg /ml Dexamethasone;Moist Heat;Traction;Ultrasound;DME Instruction;Gait training;Stair training;Functional mobility training;Therapeutic activities;Therapeutic exercise;Balance training;Neuromuscular re-education;Patient/family education;Manual techniques;Compression bandaging;Scar mobilization;Passive range of motion;Dry needling;Energy conservation;Splinting;Taping;Joint Manipulations;Spinal Manipulations    PT Next Visit Plan  Assess first rib, cervical and thoracic mobility, assess c/sp retraction, assess  paraspinals, periscapular and chest musculature for contribution to symptoms. continue with manual therapy and postural strengthening    PT Home Exercise Plan  04/15/19 scalene stretch, t/sp extension over chair, scap retraction with posterior support    Consulted and Agree with Plan of Care  Patient       Patient will benefit from skilled  therapeutic intervention in order to improve the following deficits and impairments:  Decreased activity tolerance, Decreased endurance, Decreased mobility, Decreased range of motion, Decreased strength, Hypomobility, Increased muscle spasms, Impaired tone, Improper body mechanics, Postural dysfunction, Pain  Visit Diagnosis: Cervicalgia  Other symptoms and signs involving the musculoskeletal system  Abnormal posture  Muscle weakness (generalized)     Problem List Patient Active Problem List   Diagnosis Date Noted  . H N P-LUMBAR 12/08/2008  . LOW BACK PAIN 12/08/2008  . SCIATICA 12/08/2008  . UNSPECIFIED MONONEURITIS OF UPPER LIMB 06/04/2007    1:05 PM, 04/15/19 Wyman Songster PT, DPT Physical Therapist at [facility]   [facility] [address] [place], [place], [address] Phone: [phone]   Fax:  [phone]  Name: GRAIG HESSLING MRN: [id number] Date of Birth: [birth year]

## 2019-04-15 NOTE — Patient Instructions (Signed)
Access Code: [email or web address]  URL: [email or web address]  Date: 04/15/2019  Prepared by: Greig Castilla Oslo Huntsman   Exercises Seated Cervical Sidebending Stretch - 3 reps - 30 second hold - 3x daily - 7x weekly Seated Scapular Retraction - 10 reps - 3 sets - 3-4 seconds hold - 1x daily - 7x weekly Seated Thoracic Lumbar Extension - 5 reps - 5 second hold hold - 1x daily - 7x weekly

## 2019-04-16 ENCOUNTER — Ambulatory Visit (HOSPITAL_COMMUNITY): Payer: Medicare Other | Admitting: Physical Therapy

## 2019-04-20 ENCOUNTER — Encounter (HOSPITAL_COMMUNITY): Payer: Self-pay | Admitting: Physical Therapy

## 2019-04-20 ENCOUNTER — Other Ambulatory Visit: Payer: Self-pay

## 2019-04-20 ENCOUNTER — Ambulatory Visit (HOSPITAL_COMMUNITY): Payer: Medicare Other | Admitting: Physical Therapy

## 2019-04-20 DIAGNOSIS — R293 Abnormal posture: Secondary | ICD-10-CM

## 2019-04-20 DIAGNOSIS — M6281 Muscle weakness (generalized): Secondary | ICD-10-CM

## 2019-04-20 DIAGNOSIS — R29898 Other symptoms and signs involving the musculoskeletal system: Secondary | ICD-10-CM | POA: Diagnosis not present

## 2019-04-20 DIAGNOSIS — M542 Cervicalgia: Secondary | ICD-10-CM | POA: Diagnosis not present

## 2019-04-20 NOTE — Therapy (Signed)
[facility] [address] [place], [place], [address] Phone: [phone]   Fax:  [phone]  Physical Therapy Treatment  Patient Details  Name: Richard Hampton MRN: [id number] Date of Birth: [birth year] Referring Provider (PT): Kirstie Peri MD   Encounter Date: 04/20/2019  PT End of Session - 04/20/19 1042    Visit Number  3    Number of Visits  12    Date for PT Re-Evaluation  05/20/19    Authorization Type  Medicare part A and B    Authorization Time Period  04/08/19-05/20/19 (complete PN on 10th visit)    Authorization - Visit Number  3    Authorization - Number of Visits  10    PT Start Time  1033    PT Stop Time  1113    PT Time Calculation (min)  40 min    Activity Tolerance  Patient tolerated treatment well    Behavior During Therapy  [facility] for tasks assessed/performed       Past Medical History:  Diagnosis Date  . Collapsed lung    right side  . Hyperlipidemia     Past Surgical History:  Procedure Laterality Date  . SPINE SURGERY      There were no vitals filed for this visit.  Subjective Assessment - 04/20/19 1033    Subjective  Patient reports he has been feeling good. He has been doing his exercises and they have been going alright. He has been trying to do some stretches with his arm behind his back. Patient was a little sore after last session but a warm shower helped. He feels symptoms are improving.    Limitations  Sitting;Standing;Lifting;House hold activities    Patient Stated Goals  to make his arm better    Currently in Pain?  Yes    Pain Score  4     Pain Location  Shoulder         [facility] PT Assessment - 04/20/19 0001      Palpation   Spinal mobility  R and L C2-C7 hypomobile , cervical paraspinals tender to palpation bilateral                   [facility] Adult PT Treatment/Exercise - 04/20/19 0001      Neck Exercises: Standing   Other Standing Exercises  scapular retraction, depression and  shoulder ER with band 3x10 with green band    Other Standing Exercises  Pec stretch at door frame 3x 30 second holds      Neck Exercises: Seated   Other Seated Exercise  scalenes stretch in seated 3x30 second holds on R      Manual Therapy   Manual Therapy  Joint mobilization;Soft tissue mobilization    Manual therapy comments  completed seperately from all other aspects of treatment     Joint Mobilization  First rib inferior glides grade II-III     Soft tissue mobilization  R scalenes, UT, cervical paraspinals             PT Education - 04/20/19 1039    Education Details  Patient educated on HEP    Person(s) Educated  Patient    Methods  Explanation;Handout    Comprehension  Verbalized understanding       PT Short Term Goals - 04/15/19 1303      PT SHORT TERM GOAL #1   Title  Patient will be independent with HEP in order to improve functional outcomes.  Time  3    Period  Weeks    Status  On-going    Target Date  04/29/19      PT SHORT TERM GOAL #2   Title  Patient will report at least 25% improvement in symptoms in order to improve quality of life.    Time  3    Period  Weeks    Status  On-going    Target Date  04/29/19        PT Long Term Goals - 04/15/19 1303      PT LONG TERM GOAL #1   Title  Patient will improve FOTO score by at least 6% in order to demonstrate improved tolerance to activity.    Time  6    Period  Weeks    Status  On-going      PT LONG TERM GOAL #2   Title  Patient will report at least 75% improvement in symptoms in order to improve quality of life.    Time  6    Period  Weeks    Status  On-going      PT LONG TERM GOAL #3   Title  Patient will improve cervical spine ROM by at least 25% in all planes in order to look in all directions easily while working.    Time  6    Period  Weeks    Status  On-going      PT LONG TERM GOAL #4   Title  Patient will report centeralized symptoms no greater than 2/10 for at least 1 week.     Time  6    Period  Weeks    Status  On-going            Plan - 04/20/19 1043    Clinical Impression Statement  Patient continues to have a hypomobile R first rib with decrease in tissue tension with mobilizations. He feels symptoms with mobilization into R bicep which decreases following manual therapy. Patient has hypomobile cervical segmental mobility and tender cervical paraspinals which do not reproduce his symptoms. He is able to complete all stretches and exercises with good form with min verbal cueing for positioning and mechanics. Patient will continue to benefit from skilled physical therapy in order to improve function.    Personal Factors and Comorbidities  Past/Current Experience;Education;Profession    Examination-Activity Limitations  Bend;Lift;Reach Overhead;Caring for Others    Examination-Participation Restrictions  Yard Work;Cleaning;Shop;Volunteer;Community Activity;Driving    Stability/Clinical Decision Making  --    Rehab Potential  Fair    PT Frequency  2x / week    PT Duration  6 weeks    PT Treatment/Interventions  ADLs/Self Care Home Management;Aquatic Therapy;Biofeedback;Cryotherapy;Electrical Stimulation;Iontophoresis 4mg /ml Dexamethasone;Moist Heat;Traction;Ultrasound;DME Instruction;Gait training;Stair training;Functional mobility training;Therapeutic activities;Therapeutic exercise;Balance training;Neuromuscular re-education;Patient/family education;Manual techniques;Compression bandaging;Scar mobilization;Passive range of motion;Dry needling;Energy conservation;Splinting;Taping;Joint Manipulations;Spinal Manipulations    PT Next Visit Plan  assess c/sp retraction possibly, continue with manual therapy and postural strengthening    PT Home Exercise Plan  04/15/19 scalene stretch, t/sp extension over chair, scap retraction with posterior support 04/20/19 DRE with band green 3x10    Consulted and Agree with Plan of Care  Patient       Patient will benefit from  skilled therapeutic intervention in order to improve the following deficits and impairments:  Decreased activity tolerance, Decreased endurance, Decreased mobility, Decreased range of motion, Decreased strength, Hypomobility, Increased muscle spasms, Impaired tone, Improper body mechanics, Postural dysfunction, Pain  Visit Diagnosis: Cervicalgia  Other symptoms  and signs involving the musculoskeletal system  Abnormal posture  Muscle weakness (generalized)     Problem List Patient Active Problem List   Diagnosis Date Noted  . H N P-LUMBAR 12/08/2008  . LOW BACK PAIN 12/08/2008  . SCIATICA 12/08/2008  . UNSPECIFIED MONONEURITIS OF UPPER LIMB 06/04/2007    12:49 PM, 04/20/19 Wyman Songster PT, DPT Physical Therapist at [facility]   [facility] [address] [place], [place], [address] Phone: [phone]   Fax:  [phone]  Name: Richard Hampton MRN: [id number] Date of Birth: October 09, 1975

## 2019-04-20 NOTE — Patient Instructions (Signed)
Access Code: [email or web address]  URL: [email or web address]  Date: 04/20/2019  Prepared by: [facility] Szymon Foiles   Exercises Shoulder External Rotation and Scapular Retraction with Resistance - 10 reps - 3 sets - 1x daily - 7x weekly Doorway Pec Stretch at 90 Degrees Abduction - 3 reps - 30 second holds hold - 1x daily - 7x weekly

## 2019-04-22 ENCOUNTER — Telehealth (HOSPITAL_COMMUNITY): Payer: Self-pay | Admitting: Physical Therapy

## 2019-04-22 ENCOUNTER — Ambulatory Visit (HOSPITAL_COMMUNITY): Payer: Medicare Other | Admitting: Physical Therapy

## 2019-04-22 NOTE — Telephone Encounter (Signed)
No show #2; Patient contacted and he stated he mixed up his appointment times and he was reminded of next appointment.  4:31 PM, 04/22/19 Wyman Songster PT, DPT Physical Therapist at [facility]

## 2019-04-27 ENCOUNTER — Encounter (HOSPITAL_COMMUNITY): Payer: Self-pay | Admitting: Physical Therapy

## 2019-04-27 ENCOUNTER — Other Ambulatory Visit: Payer: Self-pay

## 2019-04-27 ENCOUNTER — Ambulatory Visit (HOSPITAL_COMMUNITY): Payer: Medicare Other | Admitting: Physical Therapy

## 2019-04-27 DIAGNOSIS — M6281 Muscle weakness (generalized): Secondary | ICD-10-CM | POA: Diagnosis not present

## 2019-04-27 DIAGNOSIS — R293 Abnormal posture: Secondary | ICD-10-CM

## 2019-04-27 DIAGNOSIS — M542 Cervicalgia: Secondary | ICD-10-CM | POA: Diagnosis not present

## 2019-04-27 DIAGNOSIS — R29898 Other symptoms and signs involving the musculoskeletal system: Secondary | ICD-10-CM

## 2019-04-27 NOTE — Patient Instructions (Signed)
Access Code: [email or web address]  URL: [email or web address]  Date: 04/27/2019  Prepared by: [facility] Anders Hohmann   Exercises Standing Shoulder Row with Anchored Resistance - 15 reps - 3 sets - 1x daily - 7x weekly Shoulder extension with resistance - Neutral - 15 reps - 3 sets - 1x daily - 7x weekly Standing Shoulder Horizontal Abduction with Resistance - 10 reps - 3 sets - 1x daily - 7x weekly

## 2019-04-27 NOTE — Therapy (Signed)
[facility] [address] [place], [place], [address] Phone: [phone]   Fax:  [phone]  Physical Therapy Treatment  Patient Details  Name: Richard Hampton MRN: [id number] Date of Birth: [birth year] Referring Provider (PT): Kirstie Peri MD   Encounter Date: 04/27/2019  PT End of Session - 04/27/19 1104    Visit Number  4    Number of Visits  12    Date for PT Re-Evaluation  05/20/19    Authorization Type  Medicare part A and B    Authorization Time Period  04/08/19-05/20/19 (complete PN on 10th visit)    Authorization - Visit Number  4    Authorization - Number of Visits  10    PT Start Time  1031    PT Stop Time  1111    PT Time Calculation (min)  40 min    Activity Tolerance  Patient tolerated treatment well    Behavior During Therapy  [facility] for tasks assessed/performed       Past Medical History:  Diagnosis Date  . Collapsed lung    right side  . Hyperlipidemia     Past Surgical History:  Procedure Laterality Date  . SPINE SURGERY      There were no vitals filed for this visit.  Subjective Assessment - 04/27/19 1032    Subjective  Patient states he has not been having pain in his arm/shoulder. He feels that everything we've been doing has helped. He has been doing his exercises every day.    Limitations  Sitting;Standing;Lifting;House hold activities    Patient Stated Goals  to make his arm better    Currently in Pain?  No/denies                       [facility] Adult PT Treatment/Exercise - 04/27/19 0001      Neck Exercises: Standing   Other Standing Exercises  standing row blue band 3x15; shoulder extension 3x15    Other Standing Exercises  horizontal abduction with blue band 3x10 ; shoulder depression, retraction, exernal rotation with posterior wall support 3x10      Manual Therapy   Manual Therapy  Joint mobilization;Soft tissue mobilization    Manual therapy comments  completed seperately from all  other aspects of treatment     Joint Mobilization  First rib inferior glides grade II-III     Soft tissue mobilization  R scalenes, UT, cervical paraspinals             PT Education - 04/27/19 1036    Education Details  Exercise technique and HEP    Person(s) Educated  Patient    Methods  Explanation    Comprehension  Verbalized understanding       PT Short Term Goals - 04/15/19 1303      PT SHORT TERM GOAL #1   Title  Patient will be independent with HEP in order to improve functional outcomes.    Time  3    Period  Weeks    Status  On-going    Target Date  04/29/19      PT SHORT TERM GOAL #2   Title  Patient will report at least 25% improvement in symptoms in order to improve quality of life.    Time  3    Period  Weeks    Status  On-going    Target Date  04/29/19        PT Long Term  Goals - 04/15/19 1303      PT LONG TERM GOAL #1   Title  Patient will improve FOTO score by at least 6% in order to demonstrate improved tolerance to activity.    Time  6    Period  Weeks    Status  On-going      PT LONG TERM GOAL #2   Title  Patient will report at least 75% improvement in symptoms in order to improve quality of life.    Time  6    Period  Weeks    Status  On-going      PT LONG TERM GOAL #3   Title  Patient will improve cervical spine ROM by at least 25% in all planes in order to look in all directions easily while working.    Time  6    Period  Weeks    Status  On-going      PT LONG TERM GOAL #4   Title  Patient will report centeralized symptoms no greater than 2/10 for at least 1 week.    Time  6    Period  Weeks    Status  On-going            Plan - 04/27/19 1105    Clinical Impression Statement  Patient has improved first rib mobility with no reproduction in symptoms with mobilizations today. He is able to complete postural exercises without c/o symptoms and requires min verbal cueing for positioning and mechanics. If patient continues to  experience reduction in symptoms anticipate D/c next week but continue to improve posture and UE mechanics. Patient tends to round shoulders when performing more strenuous exercises. Patient will continue to benefit from skilled physical therapy in order to improve function and reduce impairment.    Personal Factors and Comorbidities  Past/Current Experience;Education;Profession    Examination-Activity Limitations  Bend;Lift;Reach Overhead;Caring for Others    Examination-Participation Restrictions  Yard Work;Cleaning;Shop;Volunteer;Community Activity;Driving    Rehab Potential  Fair    PT Frequency  2x / week    PT Duration  6 weeks    PT Treatment/Interventions  ADLs/Self Care Home Management;Aquatic Therapy;Biofeedback;Cryotherapy;Electrical Stimulation;Iontophoresis 4mg /ml Dexamethasone;Moist Heat;Traction;Ultrasound;DME Instruction;Gait training;Stair training;Functional mobility training;Therapeutic activities;Therapeutic exercise;Balance training;Neuromuscular re-education;Patient/family education;Manual techniques;Compression bandaging;Scar mobilization;Passive range of motion;Dry needling;Energy conservation;Splinting;Taping;Joint Manipulations;Spinal Manipulations    PT Next Visit Plan  assess c/sp retraction possibly, continue with manual therapy and postural strengtheningl; anticipate D/c if reduction in symptoms persists    PT Home Exercise Plan  04/15/19 scalene stretch, t/sp extension over chair, scap retraction with posterior support 04/20/19 DRE with band green 3x10 04/27/19 shoulder row, extensions, and horizontal abduction with band    Consulted and Agree with Plan of Care  Patient       Patient will benefit from skilled therapeutic intervention in order to improve the following deficits and impairments:  Decreased activity tolerance, Decreased endurance, Decreased mobility, Decreased range of motion, Decreased strength, Hypomobility, Increased muscle spasms, Impaired tone, Improper  body mechanics, Postural dysfunction, Pain  Visit Diagnosis: Cervicalgia  Other symptoms and signs involving the musculoskeletal system  Abnormal posture  Muscle weakness (generalized)     Problem List Patient Active Problem List   Diagnosis Date Noted  . H N P-LUMBAR 12/08/2008  . LOW BACK PAIN 12/08/2008  . SCIATICA 12/08/2008  . UNSPECIFIED MONONEURITIS OF UPPER LIMB 06/04/2007    11:16 AM, 04/27/19 Mearl Latin PT, DPT Physical Therapist at [facility] [address] [place], [place], [address] Phone: [phone]   Fax:  [phone]  Name: Richard Hampton MRN: [id number] Date of Birth: Oct 25, 1975

## 2019-04-29 ENCOUNTER — Encounter (HOSPITAL_COMMUNITY): Payer: Self-pay

## 2019-04-29 ENCOUNTER — Other Ambulatory Visit: Payer: Self-pay

## 2019-04-29 ENCOUNTER — Ambulatory Visit (HOSPITAL_COMMUNITY): Payer: Medicare Other

## 2019-04-29 DIAGNOSIS — R293 Abnormal posture: Secondary | ICD-10-CM | POA: Diagnosis not present

## 2019-04-29 DIAGNOSIS — M542 Cervicalgia: Secondary | ICD-10-CM

## 2019-04-29 DIAGNOSIS — M6281 Muscle weakness (generalized): Secondary | ICD-10-CM

## 2019-04-29 DIAGNOSIS — R29898 Other symptoms and signs involving the musculoskeletal system: Secondary | ICD-10-CM

## 2019-04-29 NOTE — Therapy (Signed)
Richard Hampton [place], [place], [address] Phone: [phone]   Fax:  [phone]  Physical Therapy Treatment  Patient Details  Name: Richard Hampton MRN: [id number] Date of Birth: Jan 23, 1976 Referring Provider (PT): Monico Blitz MD   Encounter Date: 04/29/2019  PT End of Session - 04/29/19 1139    Visit Number  5    Number of Visits  12    Date for PT Re-Evaluation  05/20/19    Authorization Type  Medicare part A and B    Authorization Time Period  04/08/19-05/20/19 (complete PN on 10th visit)    Authorization - Visit Number  5    Authorization - Number of Visits  10    PT Start Time  1135    PT Stop Time  1213    PT Time Calculation (min)  38 min    Activity Tolerance  Patient tolerated treatment well    Behavior During Therapy  [facility] for tasks assessed/performed       Past Medical History:  Diagnosis Date  . Collapsed lung    right side  . Hyperlipidemia     Past Surgical History:  Procedure Laterality Date  . SPINE SURGERY      There were no vitals filed for this visit.  Subjective Assessment - 04/29/19 1137    Subjective  Pt reports he is doing good, no reports of pain.  Reports only numbness at night and waking up, belives related to position.  Reports compliance with HEP daily.    Currently in Pain?  No/denies         [facility] PT Assessment - 04/29/19 0001      Assessment   Medical Diagnosis  Neck Pain    Referring Provider (PT)  Monico Blitz MD    Onset Date/Surgical Date  01/06/19    Hand Dominance  Right    Next MD Visit  05/12/19    Prior Therapy  For low back                   [facility] Adult PT Treatment/Exercise - 04/29/19 0001      Exercises   Exercises  Neck      Neck Exercises: Theraband   Scapula Retraction  15 reps;Blue    Shoulder Extension  15 reps;Blue    Rows  15 reps;Blue    Horizontal ABduction  15 reps;Blue      Neck Exercises: Standing   Neck Retraction  10 reps;5 secs     Neck Retraction Limitations  chin tuck front of wall    Other Standing Exercises  UE flexion with chin tuck front of wall 10x     Other Standing Exercises  --      Manual Therapy   Manual Therapy  Soft tissue mobilization;Other (comment)    Manual therapy comments  completed seperately from all other aspects of treatment     Soft tissue mobilization  R scalenes, UT, cervical paraspinals    Other Manual Therapy  position release technique 1x Bil UT x 95min each               PT Short Term Goals - 04/15/19 1303      PT SHORT TERM GOAL #1   Title  Patient will be independent with HEP in order to improve functional outcomes.    Time  3    Period  Weeks    Status  On-going    Target Date  04/29/19      PT SHORT TERM GOAL #2   Title  Patient will report at least 25% improvement in symptoms in order to improve quality of life.    Time  3    Period  Weeks    Status  On-going    Target Date  04/29/19        PT Long Term Goals - 04/15/19 1303      PT LONG TERM GOAL #1   Title  Patient will improve FOTO score by at least 6% in order to demonstrate improved tolerance to activity.    Time  6    Period  Weeks    Status  On-going      PT LONG TERM GOAL #2   Title  Patient will report at least 75% improvement in symptoms in order to improve quality of life.    Time  6    Period  Weeks    Status  On-going      PT LONG TERM GOAL #3   Title  Patient will improve cervical spine ROM by at least 25% in all planes in order to look in all directions easily while working.    Time  6    Period  Weeks    Status  On-going      PT LONG TERM GOAL #4   Title  Patient will report centeralized symptoms no greater than 2/10 for at least 1 week.    Time  6    Period  Weeks    Status  On-going            Plan - 04/29/19 1223    Clinical Impression Statement  Progressed postural strengthening with additional cervical retraction training.  Pt required demonstration, verbal and  tactile cueing to acheive correct form.  No reoprts of pain through session.  EOS with manual soft tissue mobilization to address tight musculature to assist with mobility and pain control.  Added positoin release technique to Bil UT with positve results following.    Personal Factors and Comorbidities  Past/Current Experience;Education;Profession    Examination-Activity Limitations  Bend;Lift;Reach Overhead;Caring for Others    Examination-Participation Restrictions  Yard Work;Cleaning;Shop;Volunteer;Community Activity;Driving    Stability/Clinical Decision Making  Evolving/Moderate complexity    Clinical Decision Making  Moderate    Rehab Potential  Fair    PT Frequency  2x / week    PT Duration  6 weeks    PT Treatment/Interventions  ADLs/Self Care Home Management;Aquatic Therapy;Biofeedback;Cryotherapy;Electrical Stimulation;Iontophoresis 4mg /ml Dexamethasone;Moist Heat;Traction;Ultrasound;DME Instruction;Gait training;Stair training;Functional mobility training;Therapeutic activities;Therapeutic exercise;Balance training;Neuromuscular re-education;Patient/family education;Manual techniques;Compression bandaging;Scar mobilization;Passive range of motion;Dry needling;Energy conservation;Splinting;Taping;Joint Manipulations;Spinal Manipulations    PT Next Visit Plan  assess c/sp retraction possibly, continue with manual therapy and postural strengtheningl; anticipate D/c if reduction in symptoms persists    PT Home Exercise Plan  04/15/19 scalene stretch, t/sp extension over chair, scap retraction with posterior support 04/20/19 DRE with band green 3x10 04/27/19 shoulder row, extensions, and horizontal abduction with band       Patient will benefit from skilled therapeutic intervention in order to improve the following deficits and impairments:  Decreased activity tolerance, Decreased endurance, Decreased mobility, Decreased range of motion, Decreased strength, Hypomobility, Increased muscle spasms,  Impaired tone, Improper body mechanics, Postural dysfunction, Pain  Visit Diagnosis: Abnormal posture  Muscle weakness (generalized)  Cervicalgia  Other symptoms and signs involving the musculoskeletal system     Problem List Patient Active Problem List   Diagnosis Date Noted  .  H N P-LUMBAR 12/08/2008  . LOW BACK PAIN 12/08/2008  . SCIATICA 12/08/2008  . UNSPECIFIED MONONEURITIS OF UPPER LIMB 06/04/2007   Becky Sax, LPTA; CBIS [phone]  Juel Burrow 04/29/2019, 12:28 PM  [facility] [address] [place], [place], [address] Phone: [phone]   Fax:  [phone]  Name: Royal GROSSER MRN: [id number] Date of Birth: 06-03-1975

## 2019-05-04 ENCOUNTER — Other Ambulatory Visit: Payer: Self-pay

## 2019-05-04 ENCOUNTER — Encounter (HOSPITAL_COMMUNITY): Payer: Self-pay | Admitting: Physical Therapy

## 2019-05-04 ENCOUNTER — Ambulatory Visit (HOSPITAL_COMMUNITY): Payer: Medicare Other | Attending: Internal Medicine | Admitting: Physical Therapy

## 2019-05-04 DIAGNOSIS — M6281 Muscle weakness (generalized): Secondary | ICD-10-CM | POA: Insufficient documentation

## 2019-05-04 DIAGNOSIS — R293 Abnormal posture: Secondary | ICD-10-CM | POA: Insufficient documentation

## 2019-05-04 DIAGNOSIS — M542 Cervicalgia: Secondary | ICD-10-CM | POA: Insufficient documentation

## 2019-05-04 DIAGNOSIS — R29898 Other symptoms and signs involving the musculoskeletal system: Secondary | ICD-10-CM | POA: Diagnosis not present

## 2019-05-04 NOTE — Therapy (Signed)
[facility] [address] [place], [place], [address] Phone: [phone]   Fax:  [phone]  Physical Therapy Treatment/progress note/Discharge Summary  Patient Details  Name: Richard Hampton MRN: [id number] Date of Birth: 12-Dec-1975 Referring Provider (PT): Monico Blitz MD   Encounter Date: 05/04/2019 Progress Note   Reporting Period 04/08/19 to 05/04/19   See note below for Objective Data and Assessment of Progress/Goals   PHYSICAL THERAPY DISCHARGE SUMMARY  Visits from Start of Care: 6  Current functional level related to goals / functional outcomes: See below   Remaining deficits: See below   Education / Equipment: See below  Plan: Patient agrees to discharge.  Patient goals were met. Patient is being discharged due to meeting the stated rehab goals.  ?????       PT End of Session - 05/04/19 1028    Visit Number  6    Number of Visits  12    Date for PT Re-Evaluation  05/20/19    Authorization Type  Medicare part A and B    Authorization Time Period  04/08/19-05/20/19 (complete PN on 10th visit)    Authorization - Visit Number  6    Authorization - Number of Visits  10    PT Start Time  1011    PT Stop Time  1037    PT Time Calculation (min)  26 min    Activity Tolerance  Patient tolerated treatment well    Behavior During Therapy  WFL for tasks assessed/performed       Past Medical History:  Diagnosis Date  . Collapsed lung    right side  . Hyperlipidemia     Past Surgical History:  Procedure Laterality Date  . SPINE SURGERY      There were no vitals filed for this visit.  Subjective Assessment - 05/04/19 1009    Subjective  Patient has no pain, no numbness or tingling or any problems with RUE. Patient states 90% improvement with physical therapy intervention. He is having no difficulty with things at this time.    Currently in Pain?  No/denies         [facility] PT Assessment - 05/04/19 0001       Assessment   Medical Diagnosis  Neck Pain    Referring Provider (PT)  Monico Blitz MD    Onset Date/Surgical Date  01/06/19    Hand Dominance  Right    Next MD Visit  05/12/19    Prior Therapy  For low back      Precautions   Precautions  None      Restrictions   Weight Bearing Restrictions  No      Balance Screen   Has the patient fallen in the past 6 months  No    Has the patient had a decrease in activity level because of a fear of falling?   No    Is the patient reluctant to leave their home because of a fear of falling?   No      Prior Function   Level of Independence  Independent      Cognition   Overall Cognitive Status  Within Functional Limits for tasks assessed    Attention  Focused      Observation/Other Assessments   Observations  Patient sits with slightly slouched posture with forward head and rounded shoulders.     Focus on Therapeutic Outcomes (FOTO)   4% limited      AROM  Cervical Flexion  0% limited    Cervical Extension  0% limited    Cervical - Right Side Bend  0% limited    Cervical - Left Side Bend  0% limited    Cervical - Right Rotation  0% limited    Cervical - Left Rotation  0% limited      Strength   Right Shoulder Flexion  5/5    Left Shoulder Flexion  5/5    Right Elbow Flexion  5/5    Right Elbow Extension  5/5    Left Elbow Flexion  5/5    Left Elbow Extension  5/5                   [facility] Adult PT Treatment/Exercise - 05/04/19 0001      Neck Exercises: Standing   Neck Retraction  10 reps;5 secs    Neck Retraction Limitations  chin tuck front of wall    Other Standing Exercises  UE flexion with chin tuck front of wall 10x              PT Education - 05/04/19 1044    Education Details  Review of HEP. Discussed d/c with patient, educated patient on importance of posture and body mechanics, completing HEP, reassessment findings, and returning to PT should any other issues arise.    Person(s) Educated  Patient     Methods  Explanation    Comprehension  Verbalized understanding       PT Short Term Goals - 05/04/19 1016      PT SHORT TERM GOAL #1   Title  Patient will be independent with HEP in order to improve functional outcomes.    Time  3    Period  Weeks    Status  Achieved    Target Date  04/29/19      PT SHORT TERM GOAL #2   Title  Patient will report at least 25% improvement in symptoms in order to improve quality of life.    Time  3    Period  Weeks    Status  Achieved    Target Date  04/29/19        PT Long Term Goals - 05/04/19 1016      PT LONG TERM GOAL #1   Title  Patient will improve FOTO score by at least 6% in order to demonstrate improved tolerance to activity.    Time  6    Period  Weeks    Status  Achieved      PT LONG TERM GOAL #2   Title  Patient will report at least 75% improvement in symptoms in order to improve quality of life.    Time  6    Period  Weeks    Status  Achieved      PT LONG TERM GOAL #3   Title  Patient will improve cervical spine ROM by at least 25% in all planes in order to look in all directions easily while working.    Time  6    Period  Weeks    Status  Achieved      PT LONG TERM GOAL #4   Title  Patient will report centeralized symptoms no greater than 2/10 for at least 1 week.    Time  6    Period  Weeks    Status  Achieved            Plan - 05/04/19 1046  Clinical Impression Statement  Patient has met 2/2 short term goals and 4/4 long term goals at this time. He has improved ROM, strength, and functional mobility as well as decreased UE symptoms. Patient is no longer having difficulty performing ADL. Patient continues to sit with slouched posture with rounded shoulders and he is educated on importance of proper posture to reduce risk of symptom increases. He is educated on continuing to perform HEP and returning to therapy should any other issues arise. Patient discharged from physical therapy at this time.    Personal  Factors and Comorbidities  Past/Current Experience;Education;Profession    Examination-Activity Limitations  Bend;Lift;Reach Overhead;Caring for Others    Examination-Participation Restrictions  Yard Work;Cleaning;Shop;Volunteer;Community Activity;Driving    PT Treatment/Interventions  ADLs/Self Care Home Management;Aquatic Therapy;Biofeedback;Cryotherapy;Electrical Stimulation;Iontophoresis 79m/ml Dexamethasone;Moist Heat;Traction;Ultrasound;DME Instruction;Gait training;Stair training;Functional mobility training;Therapeutic activities;Therapeutic exercise;Balance training;Neuromuscular re-education;Patient/family education;Manual techniques;Compression bandaging;Scar mobilization;Passive range of motion;Dry needling;Energy conservation;Splinting;Taping;Joint Manipulations;Spinal Manipulations    PT Next Visit Plan  N/A    PT Home Exercise Plan  04/15/19 scalene stretch, t/sp extension over chair, scap retraction with posterior support 04/20/19 DRE with band green 3x10 04/27/19 shoulder row, extensions, and horizontal abduction with band    Consulted and Agree with Plan of Care  Patient       Patient will benefit from skilled therapeutic intervention in order to improve the following deficits and impairments:  Decreased activity tolerance, Decreased endurance, Decreased mobility, Decreased range of motion, Decreased strength, Hypomobility, Increased muscle spasms, Impaired tone, Improper body mechanics, Postural dysfunction, Pain  Visit Diagnosis: Cervicalgia  Abnormal posture  Muscle weakness (generalized)  Other symptoms and signs involving the musculoskeletal system     Problem List Patient Active Problem List   Diagnosis Date Noted  . H N P-LUMBAR 12/08/2008  . LOW BACK PAIN 12/08/2008  . SCIATICA 12/08/2008  . UNSPECIFIED MONONEURITIS OF UPPER LIMB 06/04/2007   10:56 AM, 05/04/19 AMearl LatinPT, DPT [place] [facility] [address]Phone: [phone]  Fax:  [phone] Name: Richard ABEBEMRN: [id number] of Birth: 4June 29, 1977

## 2019-05-11 DIAGNOSIS — Z299 Encounter for prophylactic measures, unspecified: Secondary | ICD-10-CM | POA: Diagnosis not present

## 2019-05-11 DIAGNOSIS — Z6828 Body mass index (BMI) 28.0-28.9, adult: Secondary | ICD-10-CM | POA: Diagnosis not present

## 2019-05-11 DIAGNOSIS — Z713 Dietary counseling and surveillance: Secondary | ICD-10-CM | POA: Diagnosis not present

## 2019-05-11 DIAGNOSIS — M542 Cervicalgia: Secondary | ICD-10-CM | POA: Diagnosis not present

## 2019-05-11 DIAGNOSIS — R238 Other skin changes: Secondary | ICD-10-CM | POA: Diagnosis not present

## 2019-05-12 ENCOUNTER — Other Ambulatory Visit: Payer: Self-pay | Admitting: Nurse Practitioner

## 2019-05-12 DIAGNOSIS — M542 Cervicalgia: Secondary | ICD-10-CM

## 2019-05-15 ENCOUNTER — Ambulatory Visit (HOSPITAL_COMMUNITY)
Admission: RE | Admit: 2019-05-15 | Discharge: 2019-05-15 | Disposition: A | Discharge status: Home / Self Care | Payer: Medicare Other | Source: Ambulatory Visit | Attending: Nurse Practitioner | Admitting: Nurse Practitioner

## 2019-05-15 ENCOUNTER — Other Ambulatory Visit: Payer: Self-pay

## 2019-05-15 DIAGNOSIS — M542 Cervicalgia: Secondary | ICD-10-CM | POA: Diagnosis not present

## 2019-06-03 ENCOUNTER — Encounter (HOSPITAL_COMMUNITY): Payer: Medicare Other | Admitting: Physical Therapy

## 2019-06-08 ENCOUNTER — Encounter (HOSPITAL_COMMUNITY): Payer: Medicare Other | Admitting: Physical Therapy

## 2019-06-10 ENCOUNTER — Encounter (HOSPITAL_COMMUNITY): Payer: Medicare Other | Admitting: Physical Therapy

## 2019-06-15 ENCOUNTER — Encounter (HOSPITAL_COMMUNITY): Payer: Medicare Other | Admitting: Physical Therapy

## 2019-09-23 ENCOUNTER — Encounter (HOSPITAL_COMMUNITY): Payer: Self-pay | Admitting: Emergency Medicine

## 2019-09-23 ENCOUNTER — Emergency Department (HOSPITAL_COMMUNITY)
Admission: EM | Admit: 2019-09-23 | Discharge: 2019-09-23 | Disposition: A | Discharge status: Home / Self Care | Payer: Medicare Other | Attending: Emergency Medicine | Admitting: Emergency Medicine

## 2019-09-23 ENCOUNTER — Emergency Department (HOSPITAL_COMMUNITY): Payer: Medicare Other

## 2019-09-23 ENCOUNTER — Other Ambulatory Visit: Payer: Self-pay

## 2019-09-23 DIAGNOSIS — J4531 Mild persistent asthma with (acute) exacerbation: Secondary | ICD-10-CM | POA: Diagnosis not present

## 2019-09-23 DIAGNOSIS — F17210 Nicotine dependence, cigarettes, uncomplicated: Secondary | ICD-10-CM | POA: Insufficient documentation

## 2019-09-23 DIAGNOSIS — R079 Chest pain, unspecified: Secondary | ICD-10-CM | POA: Diagnosis present

## 2019-09-23 LAB — BASIC METABOLIC PANEL
Anion gap: 9 (ref 5–15)
BUN: 10 mg/dL (ref 6–20)
CO2: 28 mmol/L (ref 22–32)
Calcium: 9.4 mg/dL (ref 8.9–10.3)
Chloride: 101 mmol/L (ref 98–111)
Creatinine, Ser: 1 mg/dL (ref 0.61–1.24)
GFR calc Af Amer: >60 mL/min (ref >60)
GFR calc non Af Amer: >60 mL/min (ref >60)
Glucose, Bld: 94 mg/dL (ref 70–99)
Potassium: 4.4 mmol/L (ref 3.5–5.1)
Sodium: 138 mmol/L (ref 135–145)

## 2019-09-23 LAB — TROPONIN I (HIGH SENSITIVITY): Troponin I (High Sensitivity): 2 ng/L (ref <18)

## 2019-09-23 LAB — CBC
HCT: 50.7 % (ref 39.0–52.0)
Hemoglobin: 16.2 g/dL (ref 13.0–17.0)
MCH: 27.4 pg (ref 26.0–34.0)
MCHC: 32 g/dL (ref 30.0–36.0)
MCV: 85.8 fL (ref 80.0–100.0)
Platelets: 222 10*3/uL (ref 150–400)
RBC: 5.91 MIL/uL — ABNORMAL HIGH (ref 4.22–5.81)
RDW: 14.9 % (ref 11.5–15.5)
WBC: 11.2 10*3/uL — ABNORMAL HIGH (ref 4.0–10.5)
nRBC: 0 % (ref 0.0–0.2)

## 2019-09-23 MED ORDER — PREDNISONE 50 MG PO TABS
60.0000 mg | ORAL_TABLET | Freq: Once | ORAL | Status: AC
  Administered 2019-09-23: 60 mg via ORAL
  Filled 2019-09-23: qty 1

## 2019-09-23 MED ORDER — PREDNISONE 20 MG PO TABS: ORAL_TABLET | ORAL | 0 refills | Status: DC

## 2019-09-23 MED ORDER — IBUPROFEN 400 MG PO TABS
600.0000 mg | ORAL_TABLET | Freq: Once | ORAL | Status: AC
  Administered 2019-09-23: 600 mg via ORAL
  Filled 2019-09-23: qty 2

## 2019-09-23 MED ORDER — ALBUTEROL SULFATE HFA 108 (90 BASE) MCG/ACT IN AERS
8.0000 | INHALATION_SPRAY | Freq: Once | RESPIRATORY_TRACT | Status: AC
  Administered 2019-09-23: 8 via RESPIRATORY_TRACT
  Filled 2019-09-23: qty 6.7

## 2019-09-23 NOTE — ED Provider Notes (Signed)
[facility] EMERGENCY DEPARTMENT Provider Note   CSN: [id number] Arrival date & time: 09/23/19  1914     History Chief Complaint  Patient presents with  . Chest Pain    Richard Hampton is a 44 y.o. male.  HPI 44 year old male presents with chest pain, cough, shortness of breath.  Started about 2 days ago.  Has been constant since it started.  It feels like a heaviness on his chest diffusely and is worse with coughing or deep breathing.  No fevers.  No productive sputum.  Feels like prior asthma.  Inhaler does not seem to be helping.  No leg swelling.   Past Medical History:  Diagnosis Date  . Collapsed lung    right side  . Hyperlipidemia     Patient Active Problem List   Diagnosis Date Noted  . H N P-LUMBAR 12/08/2008  . LOW BACK PAIN 12/08/2008  . SCIATICA 12/08/2008  . UNSPECIFIED MONONEURITIS OF UPPER LIMB 06/04/2007    Past Surgical History:  Procedure Laterality Date  . SPINE SURGERY         Family History  Problem Relation Age of Onset  . Asthma Mother     Social History   Tobacco Use  . Smoking status: Current Every Day Smoker    Packs/day: 0.50    Types: Cigarettes  . Smokeless tobacco: Former Neurosurgeon    Types: Snuff  Vaping Use  . Vaping Use: Never used  Substance Use Topics  . Alcohol use: No  . Drug use: No    Home Medications Prior to Admission medications   Medication Sig Start Date End Date Taking? Authorizing Provider  methocarbamol (ROBAXIN) 500 MG tablet Take 2 tablets (1,000 mg total) by mouth 4 (four) times daily as needed for muscle spasms (muscle spasm/pain). 07/01/18   Samuel Jester, DO  naproxen (NAPROSYN) 250 MG tablet Take 1 tablet (250 mg total) by mouth 2 (two) times daily as needed for mild pain or moderate pain (take with food). 07/01/18   Samuel Jester, DO  oxyCODONE-acetaminophen (PERCOCET/ROXICET) 5-325 MG tablet Take 1-2 tablets by mouth every 6 (six) hours as needed for severe pain. 02/15/19   Petrucelli,  Pleas Koch, PA-C  predniSONE (DELTASONE) 20 MG tablet 2 tabs po daily x 4 days 09/23/19   Pricilla Loveless, MD    Allergies    Penicillins  Review of Systems   Review of Systems  Constitutional: Negative for fever.  Respiratory: Positive for cough, shortness of breath and wheezing.   Cardiovascular: Positive for chest pain. Negative for leg swelling.  All other systems reviewed and are negative.   Physical Exam Updated Vital Signs BP 119/83   Pulse 78   Temp 98.3 F (36.8 C) (Oral)   Resp 18   Ht 6\' 3"  (1.905 m)   Wt 97.5 kg   SpO2 95%   BMI 26.87 kg/m   Physical Exam Vitals and nursing note reviewed.  Constitutional:      General: He is not in acute distress.    Appearance: He is well-developed. He is not ill-appearing or diaphoretic.  HENT:     Head: Normocephalic and atraumatic.     Right Ear: External ear normal.     Left Ear: External ear normal.     Nose: Nose normal.  Eyes:     General:        Right eye: No discharge.        Left eye: No discharge.  Cardiovascular:     Rate  and Rhythm: Normal rate and regular rhythm.     Heart sounds: Normal heart sounds.  Pulmonary:     Effort: Pulmonary effort is normal. No tachypnea, accessory muscle usage or respiratory distress.     Breath sounds: Wheezing (diffuse, expiratory) present.     Comments: No increased WOB. Speaks in complete sentences without acute distress Chest:     Chest wall: Tenderness present.  Abdominal:     Palpations: Abdomen is soft.     Tenderness: There is no abdominal tenderness.  Musculoskeletal:     Cervical back: Neck supple.     Right lower leg: No tenderness. No edema.     Left lower leg: No tenderness. No edema.  Skin:    General: Skin is warm and dry.  Neurological:     Mental Status: He is alert.  Psychiatric:        Mood and Affect: Mood is not anxious.     ED Results / Procedures / Treatments   Labs (all labs ordered are listed, but only abnormal results are  displayed) Labs Reviewed  CBC - Abnormal; Notable for the following components:      Result Value   WBC 11.2 (*)    RBC 5.91 (*)    All other components within normal limits  BASIC METABOLIC PANEL  TROPONIN I (HIGH SENSITIVITY)    EKG EKG Interpretation  Date/Time:  Wednesday September 23 2019 19:28:11 EDT Ventricular Rate:  79 PR Interval:  168 QRS Duration: 92 QT Interval:  342 QTC Calculation: 392 R Axis:   96 Text Interpretation: Normal sinus rhythm with sinus arrhythmia Rightward axis no acute ST/T changes similar to 2020 Confirmed by Pricilla Loveless [phone]) on 09/23/2019 7:41:15 PM   Radiology DG Chest 2 View  Result Date: 09/23/2019 CLINICAL DATA:  Chest pain, SOB and cough. EXAM: CHEST - 2 VIEW COMPARISON:  07/01/2018 FINDINGS: The heart size and mediastinal contours are within normal limits. Both lungs are clear. The visualized skeletal structures are unremarkable. IMPRESSION: No active cardiopulmonary disease. Electronically Signed   By: Signa Kell M.D.   On: 09/23/2019 20:03    Procedures Procedures (including critical care time)  Medications Ordered in ED Medications  albuterol (VENTOLIN HFA) 108 (90 Base) MCG/ACT inhaler 8 puff (8 puffs Inhalation Given 09/23/19 2036)  predniSONE (DELTASONE) tablet 60 mg (60 mg Oral Given 09/23/19 2036)  ibuprofen (ADVIL) tablet 600 mg (600 mg Oral Given 09/23/19 2036)    ED Course  I have reviewed the triage vital signs and the nursing notes.  Pertinent labs & imaging results that were available during my care of the patient were reviewed by me and considered in my medical decision making (see chart for details).    MDM Rules/Calculators/A&P                          Presentation is most consistent with asthma exacerbation with chest wall pain from coughing.  Pain has been ongoing for quite some time and so only 1 troponin needed in this otherwise low risk presentation for ACS.  Doubt dissection or PE.  No signs/symptoms of  bacterial pneumonia.  Feels better with albuterol and prednisone here and will be discharged with prednisone and his inhaler.  Follow-up with PCP. Final Clinical Impression(s) / ED Diagnoses Final diagnoses:  Mild persistent asthma with exacerbation    Rx / DC Orders ED Discharge Orders         Ordered  predniSONE (DELTASONE) 20 MG tablet     Discontinue  Reprint     09/23/19 2134           Sherwood Gambler, MD 09/23/19 [phone]

## 2019-09-23 NOTE — Discharge Instructions (Signed)
If you develop fever, coughing up blood, new or worsening trouble breathing, vomiting, or any other new/concerning symptoms then return to the ER for evaluation.  Use your albuterol inhaler 2 puffs every 4 hours for the next 48 hours.  If you need it more than this return to the ER.  Start your prednisone prescription tomorrow, 6/23.

## 2019-09-23 NOTE — ED Triage Notes (Signed)
Pt here c/o CP, SHOB and it hurts to cough.

## 2019-11-24 ENCOUNTER — Emergency Department (HOSPITAL_COMMUNITY)
Admission: EM | Admit: 2019-11-24 | Discharge: 2019-11-24 | Discharge status: Against Medical Advice | Payer: Medicare Other | Attending: Emergency Medicine | Admitting: Emergency Medicine

## 2019-11-24 ENCOUNTER — Emergency Department (HOSPITAL_COMMUNITY): Payer: Medicare Other

## 2019-11-24 DIAGNOSIS — F17210 Nicotine dependence, cigarettes, uncomplicated: Secondary | ICD-10-CM | POA: Insufficient documentation

## 2019-11-24 DIAGNOSIS — R2990 Unspecified symptoms and signs involving the nervous system: Secondary | ICD-10-CM

## 2019-11-24 DIAGNOSIS — R0789 Other chest pain: Secondary | ICD-10-CM | POA: Diagnosis not present

## 2019-11-24 DIAGNOSIS — R079 Chest pain, unspecified: Secondary | ICD-10-CM | POA: Diagnosis not present

## 2019-11-24 DIAGNOSIS — R29818 Other symptoms and signs involving the nervous system: Secondary | ICD-10-CM | POA: Insufficient documentation

## 2019-11-24 DIAGNOSIS — I6622 Occlusion and stenosis of left posterior cerebral artery: Secondary | ICD-10-CM | POA: Diagnosis not present

## 2019-11-24 DIAGNOSIS — R531 Weakness: Secondary | ICD-10-CM | POA: Diagnosis not present

## 2019-11-24 DIAGNOSIS — I639 Cerebral infarction, unspecified: Secondary | ICD-10-CM | POA: Diagnosis not present

## 2019-11-24 DIAGNOSIS — R0902 Hypoxemia: Secondary | ICD-10-CM | POA: Diagnosis not present

## 2019-11-24 DIAGNOSIS — R4781 Slurred speech: Secondary | ICD-10-CM | POA: Diagnosis not present

## 2019-11-24 LAB — CBG MONITORING, ED: Glucose-Capillary: 114 mg/dL — ABNORMAL HIGH (ref 70–99)

## 2019-11-24 MED ORDER — IOHEXOL 350 MG/ML SOLN
100.0000 mL | Freq: Once | INTRAVENOUS | Status: AC | PRN
  Administered 2019-11-24: 100 mL via INTRAVENOUS

## 2019-11-24 NOTE — ED Provider Notes (Signed)
Emergency Department Provider Note   I have reviewed the triage vital signs and the nursing notes.   HISTORY  Chief Complaint Code Stroke   HPI Richard Hampton is a 44 y.o. male with past medical history reviewed below presents to the emergency department with acute onset weakness and numbness in the right arm and leg.  Patient was last normal at 7 PM today.  He was on the phone with his wife when symptoms began.  EMS was called and when they arrived found him to be weak in the right upper extremity.  They thought they appreciated some change in his speech as well.  He denies any headache.  There is mention initially of chest pain the patient is not having it currently.  He denies shortness of breath.  He has had some cough recently but no fevers.  He does smoke cigarettes. No radiation of symptoms or modifying factors. Patient notes a family history of CVA.    Past Medical History:  Diagnosis Date  . Collapsed lung    right side  . Hyperlipidemia     Patient Active Problem List   Diagnosis Date Noted  . H N P-LUMBAR 12/08/2008  . LOW BACK PAIN 12/08/2008  . SCIATICA 12/08/2008  . UNSPECIFIED MONONEURITIS OF UPPER LIMB 06/04/2007    Past Surgical History:  Procedure Laterality Date  . SPINE SURGERY      Allergies Penicillins  Family History  Problem Relation Age of Onset  . Asthma Mother     Social History Social History   Tobacco Use  . Smoking status: Current Every Day Smoker    Packs/day: 0.50    Types: Cigarettes  . Smokeless tobacco: Former Neurosurgeon    Types: Snuff  Vaping Use  . Vaping Use: Never used  Substance Use Topics  . Alcohol use: No  . Drug use: No    Review of Systems  Constitutional: No fever/chills Eyes: No visual changes. ENT: No sore throat. Cardiovascular: Positive chest pain (resolved).  Respiratory: Denies shortness of breath. Positive cough.  Gastrointestinal: No abdominal pain.  No nausea, no vomiting.  No diarrhea.  No  constipation. Genitourinary: Negative for dysuria. Musculoskeletal: Negative for back pain. Skin: Negative for rash. Neurological: Negative for headaches. Right arm and leg weakness/numbness. Question speech change.   10-point ROS otherwise negative.  ____________________________________________   PHYSICAL EXAM:  VITAL SIGNS: BP: 146/78 Pulse: 78 SPO2: 98% RA RR: 18 Temp: 98.7 F  Constitutional: Alert and oriented. Well appearing and in no acute distress. Eyes: Conjunctivae are normal. Head: Atraumatic. Nose: No congestion/rhinnorhea. Mouth/Throat: Mucous membranes are moist.  Neck: No stridor.   Cardiovascular: Normal rate, regular rhythm. Good peripheral circulation. Grossly normal heart sounds.   Respiratory: Normal respiratory effort.  No retractions. Lungs CTAB. Gastrointestinal: Soft and nontender. No distention.  Musculoskeletal: No lower extremity tenderness nor edema. No gross deformities of extremities. Neurologic: Mild dysarthria noted.  No gaze preference or clear visual field deficit.  Patient has 4-5 weakness in the right upper and lower extremity but is antigravity.  Decreased sensation in the right arm and leg.  No facial asymmetry.  Skin:  Skin is warm, dry and intact. No rash noted.   ____________________________________________   LABS (all labs ordered are listed, but only abnormal results are displayed)  Labs Reviewed  CBG MONITORING, ED - Abnormal; Notable for the following components:      Result Value   Glucose-Capillary 114 (*)    All other components within normal  limits  SARS CORONAVIRUS 2 BY RT PCR [facility] ORDER, PERFORMED IN [facility])   ____________________________________________  EKG  None  ____________________________________________  RADIOLOGY  CT Angio Head W or Wo Contrast  Result Date: 11/24/2019 CLINICAL DATA:  Right-sided weakness. EXAM: CT ANGIOGRAPHY HEAD AND NECK CT PERFUSION BRAIN TECHNIQUE:  Multidetector CT imaging of the head and neck was performed using the standard protocol during bolus administration of intravenous contrast. Multiplanar CT image reconstructions and MIPs were obtained to evaluate the vascular anatomy. Carotid stenosis measurements (when applicable) are obtained utilizing NASCET criteria, using the distal internal carotid diameter as the denominator. Multiphase CT imaging of the brain was performed following IV bolus contrast injection. Subsequent parametric perfusion maps were calculated using RAPID software. CONTRAST:  OMNIPAQUE IOHEXOL 350 MG/ML SOLN COMPARISON:  None. FINDINGS: CTA NECK FINDINGS Aortic arch: Standard 3 vessel aortic arch with widely patent arch vessel origins. Right carotid system: Patent and smooth without evidence of stenosis or dissection. Left carotid system: Patent and smooth without evidence of stenosis or dissection. Vertebral arteries: Patent and smooth without evidence of stenosis or dissection. Moderately to strongly dominant left vertebral artery. Skeleton: No acute osseous abnormality or suspicious osseous lesion. Multiple missing teeth and dental caries. Other neck: No evidence of cervical lymphadenopathy or mass. Upper chest: Mild paraseptal emphysema. Review of the MIP images confirms the above findings CTA HEAD FINDINGS Anterior circulation: The internal carotid arteries are widely patent from skull base to carotid termini. ACAs and MCAs are patent without evidence of a flow limiting A1 or M1 stenosis. The right A1 segment is hypoplastic. There is a patent anterior communicating artery. Branch vessel assessment is limited by image noise. No aneurysm is identified. Posterior circulation: The intracranial vertebral arteries are widely patent to the basilar. Patent PICA and SCA origins are identified bilaterally. The basilar artery is widely patent. Posterior communicating arteries are not clearly identified and may be diminutive or absent.  Both PCAs are patent proximally without evidence of a significant proximal stenosis on the right. There appears to be a severe proximal left P2 stenosis with attenuation of the more distal left PCA, however assessment is limited by venous contamination and image noise. No aneurysm is identified. Venous sinuses: Patent. Anatomic variants: None. Review of the MIP images confirms the above findings CT Brain Perfusion Findings: ASPECTS: 10 CBF (<30%) Volume: 0 mL Perfusion (Tmax>6.0s) volume: 6 mL Mismatch Volume: 6 mL Infarction Location: No core infarct identified by automated processing. Prolonged Tmax in the left parieto-occipital region. IMPRESSION: 1. No emergent large vessel occlusion. 2. Severe proximal left P2 stenosis with attenuation of the more distal left PCA. 3. Widely patent cervical carotid and vertebral arteries. 4. No core infarct identified by automated CTP processing. 6 mL volume of at risk tissue in the left parieto-occipital region. 5. Emphysema (ICD10-J43.9). Preliminary finding of no LVO was called by telephone to Dr. Alona Bene on 11/24/2019 at 8:17 p.m. Electronically Signed   By: Sebastian Ache M.D.   On: 11/24/2019 20:49   CT Angio Neck W and/or Wo Contrast  Result Date: 11/24/2019 CLINICAL DATA:  Right-sided weakness. EXAM: CT ANGIOGRAPHY HEAD AND NECK CT PERFUSION BRAIN TECHNIQUE: Multidetector CT imaging of the head and neck was performed using the standard protocol during bolus administration of intravenous contrast. Multiplanar CT image reconstructions and MIPs were obtained to evaluate the vascular anatomy. Carotid stenosis measurements (when applicable) are obtained utilizing NASCET criteria, using the distal internal carotid diameter as the denominator. Multiphase CT  imaging of the brain was performed following IV bolus contrast injection. Subsequent parametric perfusion maps were calculated using RAPID software. CONTRAST:  100mL OMNIPAQUE IOHEXOL 350 MG/ML SOLN COMPARISON:  None.  FINDINGS: CTA NECK FINDINGS Aortic arch: Standard 3 vessel aortic arch with widely patent arch vessel origins. Right carotid system: Patent and smooth without evidence of stenosis or dissection. Left carotid system: Patent and smooth without evidence of stenosis or dissection. Vertebral arteries: Patent and smooth without evidence of stenosis or dissection. Moderately to strongly dominant left vertebral artery. Skeleton: No acute osseous abnormality or suspicious osseous lesion. Multiple missing teeth and dental caries. Other neck: No evidence of cervical lymphadenopathy or mass. Upper chest: Mild paraseptal emphysema. Review of the MIP images confirms the above findings CTA HEAD FINDINGS Anterior circulation: The internal carotid arteries are widely patent from skull base to carotid termini. ACAs and MCAs are patent without evidence of a flow limiting A1 or M1 stenosis. The right A1 segment is hypoplastic. There is a patent anterior communicating artery. Branch vessel assessment is limited by image noise. No aneurysm is identified. Posterior circulation: The intracranial vertebral arteries are widely patent to the basilar. Patent PICA and SCA origins are identified bilaterally. The basilar artery is widely patent. Posterior communicating arteries are not clearly identified and may be diminutive or absent. Both PCAs are patent proximally without evidence of a significant proximal stenosis on the right. There appears to be a severe proximal left P2 stenosis with attenuation of the more distal left PCA, however assessment is limited by venous contamination and image noise. No aneurysm is identified. Venous sinuses: Patent. Anatomic variants: None. Review of the MIP images confirms the above findings CT Brain Perfusion Findings: ASPECTS: 10 CBF (<30%) Volume: 0 mL Perfusion (Tmax>6.0s) volume: 6 mL Mismatch Volume: 6 mL Infarction Location: No core infarct identified by automated processing. Prolonged Tmax in the left  parieto-occipital region. IMPRESSION: 1. No emergent large vessel occlusion. 2. Severe proximal left P2 stenosis with attenuation of the more distal left PCA. 3. Widely patent cervical carotid and vertebral arteries. 4. No core infarct identified by automated CTP processing. 6 mL volume of at risk tissue in the left parieto-occipital region. 5. Emphysema (ICD10-J43.9). Preliminary finding of no LVO was called by telephone to Dr. Alona BeneJoshua Werner Labella on 11/24/2019 at 8:17 p.m. Electronically Signed   By: Sebastian AcheAllen  Grady M.D.   On: 11/24/2019 20:49   CT CEREBRAL PERFUSION W CONTRAST  Result Date: 11/24/2019 CLINICAL DATA:  Right-sided weakness. EXAM: CT ANGIOGRAPHY HEAD AND NECK CT PERFUSION BRAIN TECHNIQUE: Multidetector CT imaging of the head and neck was performed using the standard protocol during bolus administration of intravenous contrast. Multiplanar CT image reconstructions and MIPs were obtained to evaluate the vascular anatomy. Carotid stenosis measurements (when applicable) are obtained utilizing NASCET criteria, using the distal internal carotid diameter as the denominator. Multiphase CT imaging of the brain was performed following IV bolus contrast injection. Subsequent parametric perfusion maps were calculated using RAPID software. CONTRAST:  100mL OMNIPAQUE IOHEXOL 350 MG/ML SOLN COMPARISON:  None. FINDINGS: CTA NECK FINDINGS Aortic arch: Standard 3 vessel aortic arch with widely patent arch vessel origins. Right carotid system: Patent and smooth without evidence of stenosis or dissection. Left carotid system: Patent and smooth without evidence of stenosis or dissection. Vertebral arteries: Patent and smooth without evidence of stenosis or dissection. Moderately to strongly dominant left vertebral artery. Skeleton: No acute osseous abnormality or suspicious osseous lesion. Multiple missing teeth and dental caries. Other neck: No evidence of  cervical lymphadenopathy or mass. Upper chest: Mild paraseptal  emphysema. Review of the MIP images confirms the above findings CTA HEAD FINDINGS Anterior circulation: The internal carotid arteries are widely patent from skull base to carotid termini. ACAs and MCAs are patent without evidence of a flow limiting A1 or M1 stenosis. The right A1 segment is hypoplastic. There is a patent anterior communicating artery. Branch vessel assessment is limited by image noise. No aneurysm is identified. Posterior circulation: The intracranial vertebral arteries are widely patent to the basilar. Patent PICA and SCA origins are identified bilaterally. The basilar artery is widely patent. Posterior communicating arteries are not clearly identified and may be diminutive or absent. Both PCAs are patent proximally without evidence of a significant proximal stenosis on the right. There appears to be a severe proximal left P2 stenosis with attenuation of the more distal left PCA, however assessment is limited by venous contamination and image noise. No aneurysm is identified. Venous sinuses: Patent. Anatomic variants: None. Review of the MIP images confirms the above findings CT Brain Perfusion Findings: ASPECTS: 10 CBF (<30%) Volume: 0 mL Perfusion (Tmax>6.0s) volume: 6 mL Mismatch Volume: 6 mL Infarction Location: No core infarct identified by automated processing. Prolonged Tmax in the left parieto-occipital region. IMPRESSION: 1. No emergent large vessel occlusion. 2. Severe proximal left P2 stenosis with attenuation of the more distal left PCA. 3. Widely patent cervical carotid and vertebral arteries. 4. No core infarct identified by automated CTP processing. 6 mL volume of at risk tissue in the left parieto-occipital region. 5. Emphysema (ICD10-J43.9). Preliminary finding of no LVO was called by telephone to Dr. Alona Bene on 11/24/2019 at 8:17 p.m. Electronically Signed   By: Sebastian Ache M.D.   On: 11/24/2019 20:49   CT HEAD CODE STROKE WO CONTRAST  Result Date: 11/24/2019 CLINICAL  DATA:  Code stroke.  Right-sided weakness. EXAM: CT HEAD WITHOUT CONTRAST TECHNIQUE: Contiguous axial images were obtained from the base of the skull through the vertex without intravenous contrast. COMPARISON:  11/19/2017 FINDINGS: Brain: There is no evidence of an acute infarct, intracranial hemorrhage, mass, midline shift, or extra-axial fluid collection. The ventricles and sulci are normal. A chronic lacunar infarct in the right thalamus is unchanged. Vascular: No hyperdense vessel. Skull: No fracture or suspicious osseous lesion. Sinuses/Orbits: Mild-to-moderate anterior ethmoid air cell mucosal thickening bilaterally. Clear mastoid air cells. Unremarkable orbits. Other: None. ASPECTS [facility] Stroke Program Early CT Score) - Ganglionic level infarction (caudate, lentiform nuclei, internal capsule, insula, M1-M3 cortex): 7 - Supraganglionic infarction (M4-M6 cortex): 3 Total score (0-10 with 10 being normal): 10 IMPRESSION: 1. No evidence of acute intracranial abnormality. 2. ASPECTS is 10. 3. Chronic right thalamic lacunar infarct. These results were called by telephone at the time of interpretation on 11/24/2019 at 8:17 pm to Dr. Alona Bene , who verbally acknowledged these results. Electronically Signed   By: Sebastian Ache M.D.   On: 11/24/2019 20:17    ____________________________________________   PROCEDURES  Procedure(s) performed:   Procedures  CRITICAL CARE Performed by: Maia Plan Total critical care time: 30 minutes Critical care time was exclusive of separately billable procedures and treating other patients. Critical care was necessary to treat or prevent imminent or life-threatening deterioration. Critical care was time spent personally by me on the following activities: development of treatment plan with patient and/or surrogate as well as nursing, discussions with consultants, evaluation of patient's response to treatment, examination of patient, obtaining history from patient  or surrogate, ordering and performing treatments  and interventions, ordering and review of laboratory studies, ordering and review of radiographic studies, pulse oximetry and re-evaluation of patient's condition.  Alona Bene, MD Emergency Medicine  ____________________________________________   INITIAL IMPRESSION / ASSESSMENT AND PLAN / ED COURSE  Pertinent labs & imaging results that were available during my care of the patient were reviewed by me and considered in my medical decision making (see chart for details).   Patient arrives to the emergency department with concern for stroke symptoms.  Last normal at 7 PM approximately 45 minutes prior to ED evaluation.  Evaluated the patient immediately upon arrival and he was taken to CT on the EMS stretcher.  Airway is being maintained.  He does have weakness in the right arm and leg on my brief, initial exam.  Sending for CTA along with perfusion studies.  08:38 PM  Patient returned from CT and was evaluated by teleneurology.  They called to discuss the case.  The patient was apparently offered TPA but told that he would have to be observed in the hospital.  The patient became abruptly agitated and angry.  He ripped out his IV using his previously affected arm and removed all EKG leads.  He walked briskly out of the emergency department with no apparent deficits.  Advised that he is leaving AGAINST MEDICAL ADVICE as he would require MRI and TIA admission given his initial presentation. Patient left the ED.  ____________________________________________  FINAL CLINICAL IMPRESSION(S) / ED DIAGNOSES  Final diagnoses:  Stroke-like symptoms     MEDICATIONS GIVEN DURING THIS VISIT:  Medications  iohexol (OMNIPAQUE) 350 MG/ML injection 100 mL (100 mLs Intravenous Contrast Given 11/24/19 2009)     Note:  This document was prepared using Dragon voice recognition software and may include unintentional dictation errors.  Alona Bene, MD,  [facility] Emergency Medicine    Halbert Jesson, Arlyss Repress, MD 11/25/19 1000

## 2019-11-24 NOTE — Progress Notes (Signed)
1954 call time 1957 beeper time 1959 exam started 2000 exam finished 2000 images sent to [facility] 2003 exam completed in epic 2004 [facility] radiology called.

## 2019-11-24 NOTE — ED Notes (Signed)
SPOK paged @ 2000

## 2019-11-24 NOTE — Consult Note (Signed)
[facility] Tele[facility]urology Consult Services   Date of Service:   11/24/2019 20:09:17  Impression:     .  R53.1 - Weakness  Comments/Sign-Out: 44 year old male who presents with complaints of right arm weakness and numbness. Symptoms were fluctuating and may have been due to TIA.  Metrics: Last Known Well: 11/24/2019 19:20:00 [facility] Notification Time: 11/24/2019 20:09:17 Arrival Time: 11/24/2019 19:56:00 Stamp Time: 11/24/2019 20:09:17 Time First Login Attempt: 11/24/2019 20:15:25 Symptoms: Right arm weakness. NIHSS Start Assessment Time: 11/24/2019 20:24:01 Patient is not a candidate for Thrombolytic. Thrombolytic Medical Decision: 11/24/2019 20:35:49 Patient was not deemed candidate for Thrombolytic because of following reasons: TPA offered but patient refused. .  CT head showed no acute hemorrhage or acute core infarct.  ED Physician notified of diagnostic impression and management plan on 11/24/2019 20:46:53  Advanced Imaging: CTA Head and Neck Completed.  CTP Completed.  LVO:No   Our recommendations are outlined below.  Recommendations:     .  Offered admission but patient became belligerent     .  If patient decides to stay then recommend admission for stroke/TIA evaluation.  --- If he stays: -Start Aspirin 81 mg  .  Activate Stroke Protocol Admission/Order Set     .  Stroke/Telemetry Floor     .  Neuro Checks     .  Bedside Swallow Eval     .  DVT Prophylaxis     .  IV Fluids, Normal Saline     .  Head of Bed 30 Degrees     .  Euglycemia and Avoid Hyperthermia (PRN Acetaminophen)    Sign Out:     .  Discussed with Emergency Department Provider    ------------------------------------------------------------------------------  History of Present Illness: Patient is a 44 year old Male.  Patient was brought by EMS for symptoms of Right arm weakness.  44 year old male who presents to the hospital because of right arm  weakness, numbness, and slurred speech. Patient states he has been at his usually baseline till about 19:20 when he was speaking to his daughter and suddenly felt his right arm go numb and weak and he had slurred speech which he reported was intermittent. Patient was offered TPA and advised that he would need to be admitted and patient became very angry and ripped out all EKG leads, IVs, and BP cuff with both hands without any evidence of weakness or difficulty.    Review of System:  14 Points Review of Systems was performed and was negative except mentioned in HPI.         Examination: BP(138/77), Pulse(101), Blood Glucose(114) 1A: Level of Consciousness - Alert; keenly responsive + 0 1B: Ask Month and Age - Both Questions Right + 0 1C: Blink Eyes & Squeeze Hands - Performs Both Tasks + 0 2: Test Horizontal Extraocular Movements - Normal + 0 3: Test Visual Fields - No Visual Loss + 0 4: Test Facial Palsy (Use Grimace if Obtunded) - Normal symmetry + 0 5A: Test Left Arm Motor Drift - No Drift for 10 Seconds + 0 5B: Test Right Arm Motor Drift - Drift, but doesn't hit bed + 1 6A: Test Left Leg Motor Drift - No Drift for 5 Seconds + 0 6B: Test Right Leg Motor Drift - No Drift for 5 Seconds + 0 7: Test Limb Ataxia (FNF/Heel-Shin) - No Ataxia + 0 8: Test Sensation - Mild-Moderate Loss: Less Sharp/More Dull + 1 9: Test Language/Aphasia - Normal; No aphasia + 0  10: Test Dysarthria - Normal + 0 11: Test Extinction/Inattention - No abnormality + 0  NIHSS Score: 2  Pre-Morbid Modified Rankin Scale: 0 Points = No symptoms at all   Patient/Family was informed the Neurology Consult would occur via TeleHealth consult by way of interactive audio and video telecommunications and consented to receiving care in this manner.   Patient is being evaluated for possible acute neurologic impairment and high probability of imminent or life-threatening deterioration. I spent total of 30 minutes providing  care to this patient, including time for face to face visit via telemedicine, review of medical records, imaging studies and discussion of findings with providers, the patient and/or family.   Dr Joice Lofts   [facility] [phone]  Case [id number]

## 2019-11-25 NOTE — ED Notes (Signed)
While speaking with the neurologist pt became red in the face and irate. Pt ripped his mask off, pulled EKG leads off and threw them against the wall, and then preceded to rip his IV out and throw it towards this nurse. Witnessed by Tele neuro and tele neuro nurse. Security notified. Pt requesting to leave facility and I explained the risks of leaving AMA and pt verbalized understanding.  MD Long notified. Pt also uncooperative in letting us obtain vital signs and EKG.

## 2020-06-09 ENCOUNTER — Emergency Department (HOSPITAL_COMMUNITY): Payer: Medicare Other

## 2020-06-09 ENCOUNTER — Other Ambulatory Visit: Payer: Self-pay

## 2020-06-09 ENCOUNTER — Encounter (HOSPITAL_COMMUNITY): Payer: Self-pay | Admitting: *Deleted

## 2020-06-09 ENCOUNTER — Emergency Department (HOSPITAL_COMMUNITY)
Admission: EM | Admit: 2020-06-09 | Discharge: 2020-06-09 | Disposition: A | Discharge status: Home / Self Care | Payer: Medicare Other | Attending: Emergency Medicine | Admitting: Emergency Medicine

## 2020-06-09 DIAGNOSIS — F17210 Nicotine dependence, cigarettes, uncomplicated: Secondary | ICD-10-CM | POA: Insufficient documentation

## 2020-06-09 DIAGNOSIS — W231XXA Caught, crushed, jammed, or pinched between stationary objects, initial encounter: Secondary | ICD-10-CM | POA: Diagnosis not present

## 2020-06-09 DIAGNOSIS — S61217A Laceration without foreign body of left little finger without damage to nail, initial encounter: Secondary | ICD-10-CM | POA: Diagnosis not present

## 2020-06-09 DIAGNOSIS — S6992XA Unspecified injury of left wrist, hand and finger(s), initial encounter: Secondary | ICD-10-CM | POA: Diagnosis present

## 2020-06-09 MED ORDER — OXYCODONE-ACETAMINOPHEN 5-325 MG PO TABS: 1.0000 | ORAL_TABLET | Freq: Four times a day (QID) | ORAL | 0 refills | Status: AC | PRN

## 2020-06-09 MED ORDER — CLINDAMYCIN HCL 300 MG PO CAPS: 600.0000 mg | ORAL_CAPSULE | Freq: Three times a day (TID) | ORAL | 0 refills | Status: AC

## 2020-06-09 MED ORDER — LIDOCAINE HCL (PF) 1 % IJ SOLN
5.0000 mL | Freq: Once | INTRAMUSCULAR | Status: AC
  Administered 2020-06-09: 5 mL
  Filled 2020-06-09: qty 30

## 2020-06-09 MED ORDER — MORPHINE SULFATE (PF) 4 MG/ML IV SOLN
4.0000 mg | Freq: Once | INTRAVENOUS | Status: AC
  Administered 2020-06-09: 4 mg via INTRAVENOUS
  Filled 2020-06-09: qty 1

## 2020-06-09 MED ORDER — CLINDAMYCIN PHOSPHATE 900 MG/50ML IV SOLN
900.0000 mg | Freq: Once | INTRAVENOUS | Status: AC
  Administered 2020-06-09: 900 mg via INTRAVENOUS
  Filled 2020-06-09: qty 50

## 2020-06-09 NOTE — ED Provider Notes (Signed)
[facility] EMERGENCY DEPARTMENT Provider Note   CSN: [id number] Arrival date & time: 06/09/20  1756     History Chief Complaint  Patient presents with  . Extremity Laceration    Richard Hampton is a 45 y.o. male.  HPI   Patient with no significant medical history presents with chief complaint of left pinky finger injury.  He is right-handed dominant, he endorses that while he was moving some lumber his pinky finger got stuck in between 2 pieces of wood.  He states he had severe pain in that finger, and was able to control the bleeding with direct pressure.  He is able to move his finger but has difficulty moving the DIP joint.  He denies paresthesia or weakness in that finger.  He is not immunocompromise, states his last tetanus shot was last year, denies any alleviating factors.  Patient has headaches, fevers, chills, shortness of breath, chest pain, abdominal pain, nausea, vomiting, diarrhea, worsening pedal edema.  Past Medical History:  Diagnosis Date  . Collapsed lung    right side  . Hyperlipidemia     Patient Active Problem List   Diagnosis Date Noted  . H N P-LUMBAR 12/08/2008  . LOW BACK PAIN 12/08/2008  . SCIATICA 12/08/2008  . UNSPECIFIED MONONEURITIS OF UPPER LIMB 06/04/2007    Past Surgical History:  Procedure Laterality Date  . SPINE SURGERY         Family History  Problem Relation Age of Onset  . Asthma Mother     Social History   Tobacco Use  . Smoking status: Current Every Day Smoker    Packs/day: 0.50    Types: Cigarettes  . Smokeless tobacco: Former Neurosurgeon    Types: Snuff  Vaping Use  . Vaping Use: Never used  Substance Use Topics  . Alcohol use: No  . Drug use: No    Home Medications Prior to Admission medications   Medication Sig Start Date End Date Taking? Authorizing Provider  clindamycin (CLEOCIN) 300 MG capsule Take 2 capsules (600 mg total) by mouth 3 (three) times daily for 5 days. 06/09/20 06/14/20 Yes Carroll Sage, PA-C   oxyCODONE-acetaminophen (PERCOCET/ROXICET) 5-325 MG tablet Take 1-2 tablets by mouth every 6 (six) hours as needed for up to 3 days for severe pain. 06/09/20 06/12/20 Yes Carroll Sage, PA-C  methocarbamol (ROBAXIN) 500 MG tablet Take 2 tablets (1,000 mg total) by mouth 4 (four) times daily as needed for muscle spasms (muscle spasm/pain). 07/01/18   Samuel Jester, DO  naproxen (NAPROSYN) 250 MG tablet Take 1 tablet (250 mg total) by mouth 2 (two) times daily as needed for mild pain or moderate pain (take with food). 07/01/18   Samuel Jester, DO  oxyCODONE-acetaminophen (PERCOCET/ROXICET) 5-325 MG tablet Take 1-2 tablets by mouth every 6 (six) hours as needed for severe pain. 02/15/19   Petrucelli, Pleas Koch, PA-C  predniSONE (DELTASONE) 20 MG tablet 2 tabs po daily x 4 days 09/23/19   Pricilla Loveless, MD    Allergies    Penicillins  Review of Systems   Review of Systems  Constitutional: Negative for chills and fever.  HENT: Negative for congestion.   Respiratory: Negative for shortness of breath.   Cardiovascular: Negative for chest pain.  Gastrointestinal: Negative for abdominal pain.  Genitourinary: Negative for enuresis.  Musculoskeletal: Negative for back pain.  Skin: Negative for rash.       Laceration to left fifth digit  Neurological: Negative for dizziness.  Hematological: Does not bruise/bleed easily.  Physical Exam Updated Vital Signs BP 132/87 (BP Location: Right Arm)   Pulse 78   Temp 98.2 F (36.8 C) (Oral)   Resp 18   SpO2 100%   Physical Exam Vitals and nursing note reviewed.  Constitutional:      General: He is not in acute distress.    Appearance: He is not ill-appearing.  HENT:     Head: Normocephalic and atraumatic.     Nose: No congestion.  Eyes:     Conjunctiva/sclera: Conjunctivae normal.  Cardiovascular:     Rate and Rhythm: Normal rate and regular rhythm.     Pulses: Normal pulses.  Pulmonary:     Effort: Pulmonary effort is  normal.  Musculoskeletal:        General: Tenderness and signs of injury present.     Comments: Patient's left hand was visualized, he has a noted U-shaped laceration on his left fifth digit on the palmar aspect.  It extends from the DIP joint up to the tip of his finger and back around.  It was hemodynamically stable.  Nailbed intact.  He had full range of motion at his MCP joint, PIP joint, decreased flexion at his DIP joint.  Neurovascular fully intact.  Skin:    General: Skin is warm and dry.  Neurological:     Mental Status: He is alert.  Psychiatric:        Mood and Affect: Mood normal.             ED Results / Procedures / Treatments   Labs (all labs ordered are listed, but only abnormal results are displayed) Labs Reviewed - No data to display  EKG None  Radiology DG Hand Complete Left  Result Date: 06/09/2020 CLINICAL DATA:  Laceration on the fifth digit, palmar aspect of the LEFT hand. EXAM: LEFT HAND - COMPLETE 3+ VIEW COMPARISON:  July 22, 2009. FINDINGS: Fracture of the distal phalanx of the small finger/fifth digit of the LEFT hand with overlying soft tissue swelling and disruption of soft tissues compatible with given history of laceration in this location. No radiopaque foreign body. Volar displacement of fracture fragments seen on lateral view. Overlapping structures limit assessment. IMPRESSION: Volar displacement associated with presumed open fracture/near traumatic amputation of the distal phalanx of the fifth digit. Electronically Signed   By: Donzetta Kohut M.D.   On: 06/09/2020 20:02    Procedures .Marland KitchenLaceration Repair  Date/Time: 06/09/2020 9:48 PM Performed by: Carroll Sage, PA-C Authorized by: Carroll Sage, PA-C   Consent:    Consent obtained:  Verbal   Consent given by:  Patient   Risks, benefits, and alternatives were discussed: yes     Risks discussed:  Infection, pain, retained foreign body, need for additional repair, poor  cosmetic result, tendon damage, vascular damage, poor wound healing and nerve damage   Alternatives discussed:  No treatment Universal protocol:    Patient identity confirmed:  Verbally with patient Anesthesia:    Anesthesia method:  Local infiltration   Local anesthetic:  Lidocaine 1% w/o epi Laceration details:    Location:  Finger   Finger location:  L small finger   Length (cm):  10   Depth (mm):  3 Pre-procedure details:    Preparation:  Patient was prepped and draped in usual sterile fashion and imaging obtained to evaluate for foreign bodies Exploration:    Hemostasis achieved with:  Direct pressure   Imaging obtained: x-ray     Imaging outcome: foreign body not noted  Wound exploration: wound explored through full range of motion and entire depth of wound visualized     Contaminated: no   Treatment:    Area cleansed with:  Betadine and saline   Amount of cleaning:  Extensive   Irrigation method:  Pressure wash   Visualized foreign bodies/material removed: no     Debridement:  None Skin repair:    Repair method:  Sutures   Suture size:  4-0   Suture technique:  Simple interrupted   Number of sutures:  12 Approximation:    Approximation:  Loose Repair type:    Repair type:  Intermediate Post-procedure details:    Dressing:  Bulky dressing and splint for protection   Procedure completion:  Tolerated well, no immediate complications Comments:     After the procedure patient motor, sensation, strength were all intact in fifth digit, area was soft to the touch with good capillary refill.     Medications Ordered in ED Medications  lidocaine (PF) (XYLOCAINE) 1 % injection 5 mL (5 mLs Infiltration Given 06/09/20 2045)  clindamycin (CLEOCIN) IVPB 900 mg (0 mg Intravenous Stopped 06/09/20 2118)  morphine 4 MG/ML injection 4 mg (4 mg Intravenous Given 06/09/20 2043)    ED Course  I have reviewed the triage vital signs and the nursing notes.  Pertinent labs & imaging  results that were available during my care of the patient were reviewed by me and considered in my medical decision making (see chart for details).    MDM Rules/Calculators/A&P                         Initial impression-patient presents with laceration to his left fifth digit.  He is alert, does not appear in acute distress, vital signs reassuring.  Concern for orthopedic injury will obtain imaging and consult with hand surgery for further evaluation.  Work-up-x-ray shows volar displacement associate with a presumed open fracture/near traumatic amputation of the distal phalanx of the fifth digit.  Reassessmen Will recommend suturing to decrease infection risk and to assist with the healing process.  Patient was agreeable with this and tolerated the procedure well. He received 12 sutures.  Neurovascular was fully intact after the procedure.  Patient has a allergy to penicillins, spoke with pharmacist Barbara Cower who recommends clindamycin, IV dose 900 mg and then 600 mg 3 times daily for next 5 days.  Consult due to the severity of the laceration concern for possible flexor tendon injury will consult with hand for further evaluation.  Spoke with Dr. Joie Bimler, he recommends suturing the wound, start patient on antibiotics, place him in a splint and seeing him outpatient on Tuesday.  Rule out-  low suspicion for ligament or tendon damage as area was palpated no gross defects noted, he had full range of motion in his MCP joint, PIP joint, but decreased range of motion with flexion of his PIP joint.  I suspect this secondary due to pain but cannot fully rule out flexor tendon damage.  Low suspicion for compartment syndrome as area was palpated it was soft to the touch, neurovascular fully intact before and after the procedure.    Plan-patient has fracture at the distal end of his fifth digit, he received 12 sutures, placed in a splint.  He was started on antibiotics and pain medications.  He will follow-up  with orthopedic surgery for further evaluation on Tuesday.  Vital signs have remained stable, no indication for hospital admission.  Patient discussed  with attending and they agreed with assessment and plan.  Patient given at home care as well strict return precautions.  Patient verbalized that they understood agreed to said plan.   Final Clinical Impression(s) / ED Diagnoses Final diagnoses:  Laceration of left little finger without foreign body without damage to nail, initial encounter    Rx / DC Orders ED Discharge Orders         Ordered    clindamycin (CLEOCIN) 300 MG capsule  3 times daily        06/09/20 2134    oxyCODONE-acetaminophen (PERCOCET/ROXICET) 5-325 MG tablet  Every 6 hours PRN        06/09/20 2134           Carroll SageFaulkner, Rosy Estabrook J, PA-C 06/09/20 2154    Bethann BerkshireZammit, Joseph, MD 06/12/20 1130

## 2020-06-09 NOTE — ED Notes (Signed)
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

## 2020-06-09 NOTE — ED Triage Notes (Signed)
Laceration to left pinky finger, caught finger between 2 pieces of wood

## 2020-06-09 NOTE — Discharge Instructions (Addendum)
You have a fracture at the very end of your finger and received 12 stitches on your finger.  I have started you on antibiotics please take as prescribed.  Also give you a prescription for narcotics please use as needed for pain.  Please beware this can make you drowsy do not consume alcohol or operate heavy machinery while taking this medication.  There is also Tylenol in it, do not take Tylenol while taking this medication.  I recommend keeping the brace on and removing it twice a day to rinse out the wound and change the dressings.   Please follow-up with Dr. Loraine Leriche cairin do not hear from his office by Monday please call as you have a follow-up ointment on Tuesday.  Your sutures will have to come out in the next 10 to 14 days you may come back to this department, go to an urgent care, follow-up with your PCP.  Come back to the emergency department if you develop chest pain, shortness of breath, severe abdominal pain, uncontrolled nausea, vomiting, diarrhea.

## 2020-06-13 DIAGNOSIS — S6992XA Unspecified injury of left wrist, hand and finger(s), initial encounter: Secondary | ICD-10-CM | POA: Diagnosis not present

## 2020-06-13 DIAGNOSIS — Z299 Encounter for prophylactic measures, unspecified: Secondary | ICD-10-CM | POA: Diagnosis not present

## 2020-06-14 ENCOUNTER — Other Ambulatory Visit: Payer: Self-pay

## 2020-06-14 ENCOUNTER — Encounter: Payer: Self-pay | Admitting: Orthopedic Surgery

## 2020-06-14 ENCOUNTER — Ambulatory Visit (INDEPENDENT_AMBULATORY_CARE_PROVIDER_SITE_OTHER): Payer: Medicare Other | Admitting: Orthopedic Surgery

## 2020-06-14 VITALS — BP 145/95 | HR 84 | Ht 75.0 in | Wt 219.0 lb

## 2020-06-14 DIAGNOSIS — S62639B Displaced fracture of distal phalanx of unspecified finger, initial encounter for open fracture: Secondary | ICD-10-CM | POA: Diagnosis not present

## 2020-06-14 DIAGNOSIS — S61217A Laceration without foreign body of left little finger without damage to nail, initial encounter: Secondary | ICD-10-CM | POA: Diagnosis not present

## 2020-06-14 NOTE — Progress Notes (Signed)
New Patient Visit  Assessment: Richard Hampton is a 45 y.o. RHD male with the following: Left small finger laceration  Plan: Sustained significant laceration to left small finger, which includes a tuft fracture.  Thus far, wound is healing well.  Small amount of serosanguineous drainage and some healthy appearing granulation tissue on ulnar aspect of his wound.  Continue to take antibiotics.  Change dressing daily.  Ok to allow soap and water to run over the wound, do not scrub and pat dry.  Elevate hand as much as possible.    At this point, his finger is healing well.  However, based on mechanism of the injury and initial pictures of the injury, it remains possible that the distal aspect of his small finger does not heal well.  As a result, he could still end up requiring surgery, which could possibly result in an amputation.  He is aware.  All questions were answered.  No change in current treatment plan.  We will see him in a week for a wound check.    Follow-up: Return in about 1 week (around 06/21/2020).  Subjective:  Chief Complaint  Patient presents with  . Hand Pain    Left hand pinky finger, patient reports a little pain.    History of Present Illness: Richard Hampton is a 45 y.o. RHD male who presents for evaluation of his left small finger.  He sustained a laceration and tuft fracture 5 days ago while at work.  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  The wound was irrigated and closed in the ED.  He has been taking antibiotics without issues.  He has returned to work.  He has been changing the wounds on his small finger on a regular basis.  His pain has been controlled, although he notes difficult to sleep at night.  He has no numbness or tingling throughout the finger.  No injuries elsewhere.   Review of Systems: No fevers or chills No numbness or tingling No chest pain No shortness of breath No bowel or bladder  dysfunction No GI distress No headaches   Medical History:  Past Medical History:  Diagnosis Date  . Collapsed lung    right side  . Hyperlipidemia     Past Surgical History:  Procedure Laterality Date  . SPINE SURGERY      Family History  Problem Relation Age of Onset  . Asthma Mother    Social History   Tobacco Use  . Smoking status: Current Every Day Smoker    Packs/day: 0.50    Types: Cigarettes  . Smokeless tobacco: Former Neurosurgeon    Types: Snuff  Vaping Use  . Vaping Use: Never used  Substance Use Topics  . Alcohol use: No  . Drug use: No    Allergies  Allergen Reactions  . Penicillins Hives    Current Meds  Medication Sig  . clindamycin (CLEOCIN) 300 MG capsule Take 2 capsules (600 mg total) by mouth 3 (three) times daily for 5 days.  . naproxen (NAPROSYN) 250 MG tablet Take 1 tablet (250 mg total) by mouth 2 (two) times daily as needed for mild pain or moderate pain (take with food).  Marland Kitchen oxyCODONE-acetaminophen (PERCOCET/ROXICET) 5-325 MG tablet Take 1-2 tablets by mouth every 6 (six) hours as needed for severe pain.    Objective: BP (!) 145/95   Pulse 84   Ht 6\' 3"  (1.905 m)   Wt  219 lb (99.3 kg)   BMI 27.37 kg/m   Physical Exam:  General: Alert and oriented.  No acute distress. Gait: Normal  Evaluation of his left hand demonstrates a traumatic laceration at the distal aspect of his finger, on the volar small finger.  This extends almost circumferentially.  The nailbed is intact.  The nail is intact.  On the ulnar aspect of the laceration there is a small amount of granulation tissue which is healthy appearing.  There is a small amount of serosanguineous drainage at the site.  Limited range of motion due to pain.  He has tenderness at the distal aspect of the finger.  Slight decreased sensation to the volar tip.  Sensation is intact otherwise.    IMAGING: I personally reviewed images previously obtained from the ED   X-ray left hand  demonstrates a small tuft fracture of the distal phalanx to the small finger.   New Medications:  No orders of the defined types were placed in this encounter.     Oliver Barre, MD  06/14/2020 9:57 PM

## 2020-06-21 ENCOUNTER — Encounter: Payer: Self-pay | Admitting: Orthopedic Surgery

## 2020-06-21 ENCOUNTER — Ambulatory Visit: Payer: Medicare Other

## 2020-06-21 ENCOUNTER — Other Ambulatory Visit: Payer: Self-pay

## 2020-06-21 ENCOUNTER — Ambulatory Visit (INDEPENDENT_AMBULATORY_CARE_PROVIDER_SITE_OTHER): Payer: Medicare Other | Admitting: Orthopedic Surgery

## 2020-06-21 VITALS — BP 132/88 | HR 89 | Ht 75.0 in | Wt 218.0 lb

## 2020-06-21 DIAGNOSIS — S61217A Laceration without foreign body of left little finger without damage to nail, initial encounter: Secondary | ICD-10-CM

## 2020-06-21 DIAGNOSIS — S62637B Displaced fracture of distal phalanx of left little finger, initial encounter for open fracture: Secondary | ICD-10-CM | POA: Diagnosis not present

## 2020-06-21 DIAGNOSIS — S61217D Laceration without foreign body of left little finger without damage to nail, subsequent encounter: Secondary | ICD-10-CM

## 2020-06-21 DIAGNOSIS — S62639B Displaced fracture of distal phalanx of unspecified finger, initial encounter for open fracture: Secondary | ICD-10-CM

## 2020-06-21 NOTE — Progress Notes (Signed)
Orthopaedic Clinic Return  Assessment: Richard Hampton is a 45 y.o. male with the following: Left small finger laceration; tuft fracture  Plan: Stitches removed today without issue.  XR demonstrates stable tuft fracture.  Wound is healing well.  Small area of the flap that is dark, but otherwise remains healthy.  Sensation is intact.  Tip of the finger is tender to palpation.  Keep wound clean, especially at work.  Follow up in 2 weeks for a wound check, sooner if issues.   Body mass index is 27.25 kg/m.  Follow-up: Return in about 2 weeks (around 07/05/2020).   Subjective:  Chief Complaint  Patient presents with  . Hand Injury    Patient reports that he is doing better and is wanting stitches,    History of Present Illness: Richard Hampton is a 45 y.o. male who returns to clinic for repeat evaluation of a left small finger laceration.   He continues to improve.  Pain is getting better.  He has kept his finger covered.  He wants to have the stitches removed.   No fever or chills.   Review of Systems: No fevers or chills No numbness or tingling No chest pain No shortness of breath No bowel or bladder dysfunction No GI distress No headaches  Objective: BP 132/88   Pulse 89   Ht 6\' 3"  (1.905 m)   Wt 218 lb (98.9 kg)   BMI 27.25 kg/m   Physical Exam:  Left small finger with stitches that are ready to be removed.   Sensation intact throughout the finger.  Tip of the finger is very tender.  Able to flex the DIP, but this is painful.  On the ulnar aspect of the flap, there is a triangular shaped portion that is dark appearing.  Some ecchymosis over volar finger otherwise.   IMAGING: I personally ordered and reviewed the following images:  XR of the left hand demonstrates tuft fracture of the small finger without interval displacement. Mild distraction of the fracture.    Impression: tuft fracture left small finger in acceptable alignment.   ,  MD 06/21/2020 10:29 PM

## 2020-07-05 ENCOUNTER — Ambulatory Visit (INDEPENDENT_AMBULATORY_CARE_PROVIDER_SITE_OTHER): Payer: Medicare Other | Admitting: Orthopedic Surgery

## 2020-07-05 ENCOUNTER — Other Ambulatory Visit: Payer: Self-pay

## 2020-07-05 ENCOUNTER — Encounter: Payer: Self-pay | Admitting: Orthopedic Surgery

## 2020-07-05 VITALS — Ht 75.0 in | Wt 218.0 lb

## 2020-07-05 DIAGNOSIS — S61217D Laceration without foreign body of left little finger without damage to nail, subsequent encounter: Secondary | ICD-10-CM

## 2020-07-05 DIAGNOSIS — S62639B Displaced fracture of distal phalanx of unspecified finger, initial encounter for open fracture: Secondary | ICD-10-CM

## 2020-07-05 NOTE — Progress Notes (Signed)
Orthopaedic Clinic Return  Assessment: Richard Hampton is a 45 y.o. male with the following: Left small finger laceration; tuft fracture  Plan: Wound is healing well.  His pain continues to improve.  Tip of finger may remain tender for several more weeks as the fracture heals.  Recommend he continue to cover the wound and keep it clean while working.  Medications as needed.  I have encouraged him to contact the clinic if he has any further issues.  Follow-up: Return if symptoms worsen or fail to improve.   Subjective:  Chief Complaint  Patient presents with  . Hand Injury    Lt hand pinky finger DOI 06/09/20    History of Present Illness: Richard Hampton is a 45 y.o. male who returns to clinic for repeat evaluation of a left small finger laceration.   The pain continues to improve.  He has kept his finger covered while working.  He states that some of the skin on the volar aspect of the small finger sloughed off recently, and the underlying skin has been raw.  Otherwise, his pain is controlled.  He does note tenderness when he bumps the tip of his finger.  Overall, he is pleased with his improvements and has no concerns at this time.  Review of Systems: No fevers or chills No numbness or tingling No chest pain No shortness of breath No bowel or bladder dysfunction No GI distress No headaches  Objective: Ht 6\' 3"  (1.905 m)   Wt 218 lb (98.9 kg)   BMI 27.25 kg/m   Physical Exam:  Left small finger with healing volar wound.  Small area on the ulnar aspect of the wound which remains unhealed.  Small amount of drainage at the site.  Otherwise it is very clean.  Tip of the finger is exquisitely tender.  Excellent perfusion to the distal aspect of the finger.  Nail remains intact.  He has full range of motion of his small finger, as well as his left hand.  IMAGING: I personally ordered and reviewed the following images:  No new imaging obtained today.  ,  MD 07/05/2020 4:18 PM

## 2020-10-18 DIAGNOSIS — Z20822 Contact with and (suspected) exposure to covid-19: Secondary | ICD-10-CM | POA: Diagnosis not present

## 2020-12-01 DIAGNOSIS — Z20822 Contact with and (suspected) exposure to covid-19: Secondary | ICD-10-CM | POA: Diagnosis not present

## 2020-12-12 DIAGNOSIS — Z1339 Encounter for screening examination for other mental health and behavioral disorders: Secondary | ICD-10-CM | POA: Diagnosis not present

## 2020-12-12 DIAGNOSIS — Z7189 Other specified counseling: Secondary | ICD-10-CM | POA: Diagnosis not present

## 2020-12-12 DIAGNOSIS — R5383 Other fatigue: Secondary | ICD-10-CM | POA: Diagnosis not present

## 2020-12-12 DIAGNOSIS — E7800 Pure hypercholesterolemia, unspecified: Secondary | ICD-10-CM | POA: Diagnosis not present

## 2020-12-12 DIAGNOSIS — Z1331 Encounter for screening for depression: Secondary | ICD-10-CM | POA: Diagnosis not present

## 2020-12-12 DIAGNOSIS — Z6828 Body mass index (BMI) 28.0-28.9, adult: Secondary | ICD-10-CM | POA: Diagnosis not present

## 2020-12-12 DIAGNOSIS — F17210 Nicotine dependence, cigarettes, uncomplicated: Secondary | ICD-10-CM | POA: Diagnosis not present

## 2020-12-12 DIAGNOSIS — Z0000 Encounter for general adult medical examination without abnormal findings: Secondary | ICD-10-CM | POA: Diagnosis not present

## 2020-12-12 DIAGNOSIS — J45909 Unspecified asthma, uncomplicated: Secondary | ICD-10-CM | POA: Diagnosis not present

## 2020-12-12 DIAGNOSIS — Z2821 Immunization not carried out because of patient refusal: Secondary | ICD-10-CM | POA: Diagnosis not present

## 2020-12-12 DIAGNOSIS — Z299 Encounter for prophylactic measures, unspecified: Secondary | ICD-10-CM | POA: Diagnosis not present

## 2021-02-09 DIAGNOSIS — Z20822 Contact with and (suspected) exposure to covid-19: Secondary | ICD-10-CM | POA: Diagnosis not present

## 2021-05-05 DIAGNOSIS — Z20828 Contact with and (suspected) exposure to other viral communicable diseases: Secondary | ICD-10-CM | POA: Diagnosis not present

## 2021-05-06 ENCOUNTER — Other Ambulatory Visit: Payer: Self-pay

## 2021-05-06 ENCOUNTER — Encounter (HOSPITAL_COMMUNITY): Payer: Self-pay

## 2021-05-06 ENCOUNTER — Emergency Department (HOSPITAL_COMMUNITY)
Admission: EM | Admit: 2021-05-06 | Discharge: 2021-05-06 | Disposition: A | Discharge status: Home / Self Care | Payer: Medicare Other | Attending: Emergency Medicine | Admitting: Emergency Medicine

## 2021-05-06 DIAGNOSIS — Y93H3 Activity, building and construction: Secondary | ICD-10-CM | POA: Insufficient documentation

## 2021-05-06 DIAGNOSIS — R21 Rash and other nonspecific skin eruption: Secondary | ICD-10-CM | POA: Insufficient documentation

## 2021-05-06 DIAGNOSIS — T2145XA Corrosion of unspecified degree of buttock, initial encounter: Secondary | ICD-10-CM | POA: Insufficient documentation

## 2021-05-06 DIAGNOSIS — T542X1A Toxic effect of corrosive acids and acid-like substances, accidental (unintentional), initial encounter: Secondary | ICD-10-CM | POA: Diagnosis not present

## 2021-05-06 DIAGNOSIS — T304 Corrosion of unspecified body region, unspecified degree: Secondary | ICD-10-CM

## 2021-05-06 DIAGNOSIS — Y92009 Unspecified place in unspecified non-institutional (private) residence as the place of occurrence of the external cause: Secondary | ICD-10-CM | POA: Diagnosis not present

## 2021-05-06 DIAGNOSIS — X12XXXA Contact with other hot fluids, initial encounter: Secondary | ICD-10-CM | POA: Diagnosis not present

## 2021-05-06 DIAGNOSIS — T2102XA Burn of unspecified degree of abdominal wall, initial encounter: Secondary | ICD-10-CM | POA: Diagnosis not present

## 2021-05-06 DIAGNOSIS — T310 Burns involving less than 10% of body surface: Secondary | ICD-10-CM | POA: Diagnosis not present

## 2021-05-06 MED ORDER — OXYCODONE-ACETAMINOPHEN 5-325 MG PO TABS
2.0000 | ORAL_TABLET | Freq: Once | ORAL | Status: AC
  Administered 2021-05-06: 2 via ORAL
  Filled 2021-05-06: qty 2

## 2021-05-06 MED ORDER — HYDROCODONE-ACETAMINOPHEN 5-325 MG PO TABS: 2.0000 | ORAL_TABLET | Freq: Four times a day (QID) | ORAL | 0 refills | Status: AC | PRN

## 2021-05-06 MED ORDER — BACITRACIN ZINC 500 UNIT/GM EX OINT
1.0000 "application " | TOPICAL_OINTMENT | Freq: Two times a day (BID) | CUTANEOUS | Status: DC
  Administered 2021-05-06: 1 via TOPICAL
  Filled 2021-05-06: qty 5.4

## 2021-05-06 MED ORDER — NAPROXEN 500 MG PO TABS: 500.0000 mg | ORAL_TABLET | Freq: Two times a day (BID) | ORAL | 0 refills | Status: DC

## 2021-05-06 MED ORDER — GABAPENTIN 100 MG PO CAPS: 100.0000 mg | ORAL_CAPSULE | Freq: Three times a day (TID) | ORAL | 0 refills | Status: AC

## 2021-05-06 NOTE — ED Provider Notes (Signed)
[facility] EMERGENCY DEPARTMENT Provider Note   CSN: [id number] Arrival date & time: 05/06/21  2135     History  Chief Complaint  Patient presents with   Chemical Exposure    Richard Hampton is a 46 y.o. male.  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  He states that he was laying in a puddle of liquid that was sitting right beside this plumbing material known as "Crystal acid".  He immediately came out from under the house, took off all of his close and went to the shower, he washed in the shower for a couple of minutes to get his skin completely cleaned.  Nevertheless within the next couple of minutes he started to develop a rash in that area on the left flank radiating down into his left buttock and thigh.  There is no bleeding but it is extremely tender to the touch.  He has been applying different topical salves including a hydrocortisone, none of this is helped and he still has ongoing pain.  No fevers no other symptoms.  Occurred multiple hours ago  Home Medications Prior to Admission medications   Medication Sig Start Date End Date Taking? Authorizing Provider  gabapentin (NEURONTIN) 100 MG capsule Take 1 capsule (100 mg total) by mouth 3 (three) times daily. 05/06/21 06/05/21 Yes Eber Hong, MD  HYDROcodone-acetaminophen (NORCO/VICODIN) 5-325 MG tablet Take 2 tablets by mouth every 6 (six) hours as needed for up to 3 days. 05/06/21 05/09/21 Yes Eber Hong, MD  naproxen (NAPROSYN) 500 MG tablet Take 1 tablet (500 mg total) by mouth 2 (two) times daily with a meal. 05/06/21  Yes Eber Hong, MD      Allergies    Penicillins    Review of Systems   Review of Systems  All other systems reviewed and are negative.  Physical Exam Updated Vital Signs BP 115/62 (BP Location: Right Arm)    Pulse 78    Temp 97.6 F (36.4 C) (Oral)    Resp 17    Ht 1.905 m (6\' 3" )     Wt 99.7 kg    SpO2 97%    BMI 27.49 kg/m  Physical Exam Vitals and nursing note reviewed.  Constitutional:      General: He is not in acute distress.    Appearance: He is well-developed.  HENT:     Head: Normocephalic and atraumatic.     Mouth/Throat:     Pharynx: No oropharyngeal exudate.  Eyes:     General: No scleral icterus.       Right eye: No discharge.        Left eye: No discharge.     Conjunctiva/sclera: Conjunctivae normal.     Pupils: Pupils are equal, round, and reactive to light.  Neck:     Thyroid: No thyromegaly.     Vascular: No JVD.  Cardiovascular:     Rate and Rhythm: Normal rate and regular rhythm.     Heart sounds: Normal heart sounds. No murmur heard.   No friction rub. No gallop.  Pulmonary:     Effort: Pulmonary effort is normal. No respiratory distress.     Breath sounds: Normal breath sounds. No wheezing or rales.  Abdominal:     General: Bowel sounds are normal. There is no distension.     Palpations: Abdomen is soft. There is no  mass.     Tenderness: There is no abdominal tenderness.  Musculoskeletal:        General: No tenderness. Normal range of motion.     Cervical back: Normal range of motion and neck supple.  Lymphadenopathy:     Cervical: No cervical adenopathy.  Skin:    General: Skin is warm and dry.     Findings: Rash present. No erythema.     Comments: There is a rash present over the left mid abdomen going down onto the left buttock.  It is erythematous, papular, there is no vesicles pustules or bulla.  There is no petechiae or purpura  Neurological:     Mental Status: He is alert.     Coordination: Coordination normal.  Psychiatric:        Behavior: Behavior normal.    ED Results / Procedures / Treatments   Labs (all labs ordered are listed, but only abnormal results are displayed) Labs Reviewed - No data to display  EKG None  Radiology No results found.  Procedures Procedures    Medications Ordered in  ED Medications  bacitracin ointment 1 application (has no administration in time range)  oxyCODONE-acetaminophen (PERCOCET/ROXICET) 5-325 MG per tablet 2 tablet (has no administration in time range)    ED Course/ Medical Decision Making/ A&P                           Medical Decision Making  Rash consistent with a chemical exposure, likely has some topical neuropathy secondary to this acid.  He has already washed it off completely.  There does not appear to be any significant deep tissue injury however I have recommended that the patient use several different approaches including his local skin care, topical anesthetics, will give him a course of gabapentin as well as some stronger medicines for the next few days with close follow-up.  He is agreeable.  I consider doing imaging however this does not appear to be a deep tissue or necrotizing infection or process  No consultation was needed  I reviewed the electronic medical record, external records reviewed showing that the patient has had office visits in follow-up after injury to his finger but does not have any chronic ongoing evaluation or treatments.  Patient expresses understanding to the indications for return, prescriptions given for home, medication management: Prescriptions for hydrocodone, gabapentin, Naprosyn        Final Clinical Impression(s) / ED Diagnoses Final diagnoses:  Chemical burn of skin    Rx / DC Orders ED Discharge Orders          Ordered    HYDROcodone-acetaminophen (NORCO/VICODIN) 5-325 MG tablet  Every 6 hours PRN        05/06/21 2230    gabapentin (NEURONTIN) 100 MG capsule  3 times daily        05/06/21 2230    naproxen (NAPROSYN) 500 MG tablet  2 times daily with meals        05/06/21 2230              Eber Hong, MD 05/06/21 2231

## 2021-05-06 NOTE — Discharge Instructions (Addendum)
Take gabapentin 3 times a day, this will help with nerve pain, Naprosyn twice a day, you may use hydrocodone 1 or 2 tablets every 6 hours as needed for severe pain but be aware that this may make you nauseated or cause constipation.  Please seek medical exam with your doctor within 3 days for recheck, keep the area clean and dry applying a topical antibiotic ointment and a sterile dressing twice daily.

## 2021-05-06 NOTE — ED Triage Notes (Signed)
Pt reports chemical exposure to left flank/hip area that occurred this afternoon. Pt was exposed to crystal acid while working on plumbing issue. Rash noted to left flank. Wiped area with cold water and then Cold press was applied.

## 2021-05-09 DIAGNOSIS — T304 Corrosion of unspecified body region, unspecified degree: Secondary | ICD-10-CM | POA: Diagnosis not present

## 2021-05-09 DIAGNOSIS — F17210 Nicotine dependence, cigarettes, uncomplicated: Secondary | ICD-10-CM | POA: Diagnosis not present

## 2021-05-09 DIAGNOSIS — Z299 Encounter for prophylactic measures, unspecified: Secondary | ICD-10-CM | POA: Diagnosis not present

## 2021-05-09 DIAGNOSIS — Z789 Other specified health status: Secondary | ICD-10-CM | POA: Diagnosis not present

## 2021-05-25 DIAGNOSIS — H209 Unspecified iridocyclitis: Secondary | ICD-10-CM | POA: Diagnosis not present

## 2021-05-30 DIAGNOSIS — Z20822 Contact with and (suspected) exposure to covid-19: Secondary | ICD-10-CM | POA: Diagnosis not present

## 2021-05-30 DIAGNOSIS — H209 Unspecified iridocyclitis: Secondary | ICD-10-CM | POA: Diagnosis not present

## 2021-06-12 DIAGNOSIS — H21543 Posterior synechiae (iris), bilateral: Secondary | ICD-10-CM | POA: Diagnosis not present

## 2021-06-12 DIAGNOSIS — Z1159 Encounter for screening for other viral diseases: Secondary | ICD-10-CM | POA: Diagnosis not present

## 2021-06-12 DIAGNOSIS — H209 Unspecified iridocyclitis: Secondary | ICD-10-CM | POA: Diagnosis not present

## 2021-06-12 DIAGNOSIS — H3581 Retinal edema: Secondary | ICD-10-CM | POA: Diagnosis not present

## 2021-06-15 DIAGNOSIS — H209 Unspecified iridocyclitis: Secondary | ICD-10-CM | POA: Diagnosis not present

## 2021-06-23 DIAGNOSIS — F17210 Nicotine dependence, cigarettes, uncomplicated: Secondary | ICD-10-CM | POA: Diagnosis not present

## 2021-06-23 DIAGNOSIS — J45909 Unspecified asthma, uncomplicated: Secondary | ICD-10-CM | POA: Diagnosis not present

## 2021-06-23 DIAGNOSIS — Z299 Encounter for prophylactic measures, unspecified: Secondary | ICD-10-CM | POA: Diagnosis not present

## 2021-06-27 DIAGNOSIS — Z20822 Contact with and (suspected) exposure to covid-19: Secondary | ICD-10-CM | POA: Diagnosis not present

## 2021-07-02 DIAGNOSIS — Z20822 Contact with and (suspected) exposure to covid-19: Secondary | ICD-10-CM | POA: Diagnosis not present

## 2021-07-31 DIAGNOSIS — Z20822 Contact with and (suspected) exposure to covid-19: Secondary | ICD-10-CM | POA: Diagnosis not present

## 2021-08-03 DIAGNOSIS — Z20822 Contact with and (suspected) exposure to covid-19: Secondary | ICD-10-CM | POA: Diagnosis not present

## 2021-08-21 ENCOUNTER — Encounter (HOSPITAL_COMMUNITY): Payer: Self-pay | Admitting: *Deleted

## 2021-08-21 ENCOUNTER — Emergency Department (HOSPITAL_COMMUNITY): Payer: Medicare Other

## 2021-08-21 ENCOUNTER — Other Ambulatory Visit: Payer: Self-pay

## 2021-08-21 ENCOUNTER — Emergency Department (HOSPITAL_COMMUNITY)
Admission: EM | Admit: 2021-08-21 | Discharge: 2021-08-21 | Disposition: A | Discharge status: Home / Self Care | Payer: Medicare Other | Attending: Emergency Medicine | Admitting: Emergency Medicine

## 2021-08-21 DIAGNOSIS — R079 Chest pain, unspecified: Secondary | ICD-10-CM | POA: Diagnosis not present

## 2021-08-21 DIAGNOSIS — J45901 Unspecified asthma with (acute) exacerbation: Secondary | ICD-10-CM | POA: Diagnosis not present

## 2021-08-21 DIAGNOSIS — J4541 Moderate persistent asthma with (acute) exacerbation: Secondary | ICD-10-CM | POA: Diagnosis not present

## 2021-08-21 DIAGNOSIS — R0602 Shortness of breath: Secondary | ICD-10-CM | POA: Diagnosis not present

## 2021-08-21 DIAGNOSIS — J449 Chronic obstructive pulmonary disease, unspecified: Secondary | ICD-10-CM | POA: Insufficient documentation

## 2021-08-21 DIAGNOSIS — R062 Wheezing: Secondary | ICD-10-CM | POA: Diagnosis not present

## 2021-08-21 HISTORY — DX: Chronic obstructive pulmonary disease, unspecified: J44.9

## 2021-08-21 LAB — CBC WITH DIFFERENTIAL/PLATELET
Abs Immature Granulocytes: 0.04 10*3/uL (ref 0.00–0.07)
Basophils Absolute: 0.1 10*3/uL (ref 0.0–0.1)
Basophils Relative: 1 %
Eosinophils Absolute: 0.5 10*3/uL (ref 0.0–0.5)
Eosinophils Relative: 6 %
HCT: 46.8 % (ref 39.0–52.0)
Hemoglobin: 15.4 g/dL (ref 13.0–17.0)
Immature Granulocytes: 0 %
Lymphocytes Relative: 27 %
Lymphs Abs: 2.5 10*3/uL (ref 0.7–4.0)
MCH: 27.8 pg (ref 26.0–34.0)
MCHC: 32.9 g/dL (ref 30.0–36.0)
MCV: 84.6 fL (ref 80.0–100.0)
Monocytes Absolute: 0.6 10*3/uL (ref 0.1–1.0)
Monocytes Relative: 7 %
Neutro Abs: 5.4 10*3/uL (ref 1.7–7.7)
Neutrophils Relative %: 59 %
Platelets: 234 10*3/uL (ref 150–400)
RBC: 5.53 MIL/uL (ref 4.22–5.81)
RDW: 14.4 % (ref 11.5–15.5)
WBC: 9.1 10*3/uL (ref 4.0–10.5)
nRBC: 0 % (ref 0.0–0.2)

## 2021-08-21 LAB — BASIC METABOLIC PANEL
Anion gap: 7 (ref 5–15)
BUN: 16 mg/dL (ref 6–20)
CO2: 26 mmol/L (ref 22–32)
Calcium: 9.2 mg/dL (ref 8.9–10.3)
Chloride: 104 mmol/L (ref 98–111)
Creatinine, Ser: 0.92 mg/dL (ref 0.61–1.24)
GFR, Estimated: >60 mL/min (ref >60)
Glucose, Bld: 106 mg/dL — ABNORMAL HIGH (ref 70–99)
Potassium: 3.8 mmol/L (ref 3.5–5.1)
Sodium: 137 mmol/L (ref 135–145)

## 2021-08-21 MED ORDER — IPRATROPIUM-ALBUTEROL 0.5-2.5 (3) MG/3ML IN SOLN
3.0000 mL | Freq: Once | RESPIRATORY_TRACT | Status: AC
  Administered 2021-08-21: 3 mL via RESPIRATORY_TRACT
  Filled 2021-08-21: qty 3

## 2021-08-21 MED ORDER — PREDNISONE 20 MG PO TABS: 40.0000 mg | ORAL_TABLET | Freq: Every day | ORAL | 0 refills | Status: DC

## 2021-08-21 MED ORDER — PREDNISONE 20 MG PO TABS
40.0000 mg | ORAL_TABLET | Freq: Once | ORAL | Status: AC
  Administered 2021-08-21: 40 mg via ORAL
  Filled 2021-08-21: qty 2

## 2021-08-21 NOTE — ED Triage Notes (Signed)
Pt c/o sob and back pain for the last few days; pt has been using inhaler with no relief

## 2021-08-21 NOTE — Discharge Instructions (Signed)
As discussed, your symptoms are likely related to an exacerbation of asthma.  I recommend that you continue your albuterol nebulizer treatments, 1 every 4-6 hours for the first 2 to 3 days.  Start the prednisone prescription tomorrow.  Please follow-up with your primary care provider for recheck or return to the emergency department for any new or worsening symptoms.

## 2021-08-21 NOTE — ED Provider Notes (Signed)
[facility] EMERGENCY DEPARTMENT Provider Note   CSN: [id number] Arrival date & time: 08/21/21  1710     History  Chief Complaint  Patient presents with   Shortness of Breath    Richard Hampton is a 46 y.o. male.   Shortness of Breath Associated symptoms: no abdominal pain, no chest pain, no fever, no headaches, no sore throat and no vomiting       Richard Hampton is a 46 y.o. male with past medical history of COPD, low back pain and prior collapsed lung who presents to the Emergency Department complaining of mid back pain and shortness of breath.  Symptoms present for several days.  He is using albuterol inhaler and nebulizer without relief.  States that his PCP gave him a disc inhaler as a sample trial that helped significantly, but he has been unable to get a prescription for the medication.  He has contacted PCPs office without refill being called in.  He notes having a mostly nonproductive cough as well.  States the cough elicits the back pain.  He also has pain when something applies pressure to his back or with walking or sitting upright.  Pain improves when lying supine on either side.  He denies any fever, chills, hemoptysis, pain of his abdomen or lower extremities.   Home Medications Prior to Admission medications   Medication Sig Start Date End Date Taking? Authorizing Provider  gabapentin (NEURONTIN) 100 MG capsule Take 1 capsule (100 mg total) by mouth 3 (three) times daily. 05/06/21 06/05/21  Eber Hong, MD  naproxen (NAPROSYN) 500 MG tablet Take 1 tablet (500 mg total) by mouth 2 (two) times daily with a meal. 05/06/21   Eber Hong, MD      Allergies    Penicillins    Review of Systems   Review of Systems  Constitutional:  Negative for chills and fever.  HENT:  Negative for congestion, sore throat and trouble swallowing.   Respiratory:  Positive for shortness of breath.   Cardiovascular:  Negative for chest pain.  Gastrointestinal:  Negative for abdominal  pain, nausea and vomiting.  Genitourinary:  Negative for dysuria and flank pain.  Musculoskeletal:  Positive for back pain.  Neurological:  Negative for dizziness, seizures, weakness, numbness and headaches.  All other systems reviewed and are negative.  Physical Exam Updated Vital Signs BP 112/75   Pulse 91   Temp (!) 97.4 F (36.3 C) (Oral)   Resp 18   Ht 6\' 3"  (1.905 m)   Wt 106.6 kg   SpO2 95%   BMI 29.37 kg/m  Physical Exam Vitals and nursing note reviewed.  Constitutional:      General: He is not in acute distress.    Appearance: He is well-developed. He is not ill-appearing or toxic-appearing.  HENT:     Mouth/Throat:     Mouth: Mucous membranes are moist.     Pharynx: Oropharynx is clear.  Cardiovascular:     Rate and Rhythm: Normal rate and regular rhythm.     Pulses: Normal pulses.  Pulmonary:     Effort: Pulmonary effort is normal. No accessory muscle usage or respiratory distress.     Breath sounds: Wheezing present.     Comments: Scattered expiratory wheezes heard throughout, no increased work of breathing Abdominal:     Palpations: Abdomen is soft.     Tenderness: There is no abdominal tenderness.  Musculoskeletal:        General: Normal range of motion.  Cervical back: Normal range of motion.     Right lower leg: No edema.     Left lower leg: No edema.  Skin:    General: Skin is warm.     Capillary Refill: Capillary refill takes less than 2 seconds.     Findings: No rash.  Neurological:     General: No focal deficit present.     Mental Status: He is alert.     Sensory: No sensory deficit.     Motor: No weakness.    ED Results / Procedures / Treatments   Labs (all labs ordered are listed, but only abnormal results are displayed) Labs Reviewed  BASIC METABOLIC PANEL - Abnormal; Notable for the following components:      Result Value   Glucose, Bld 106 (*)    All other components within normal limits  CBC WITH DIFFERENTIAL/PLATELET     EKG None  Radiology DG Chest Port 1 View  Result Date: 08/21/2021 CLINICAL DATA:  Provided history: Wheezing. Additional history provided: Patient reports shortness of breath and back pain. History of COPD, smoker. EXAM: PORTABLE CHEST 1 VIEW COMPARISON:  Prior chest radiographs 09/23/2019 and earlier. FINDINGS: Heart size within normal limits. No appreciable airspace consolidation. No evidence of pleural effusion or pneumothorax. No acute bony abnormality identified. IMPRESSION: No evidence of acute cardiopulmonary abnormality. Electronically Signed   By: Jackey Loge D.O.   On: 08/21/2021 18:45    Procedures Procedures    Medications Ordered in ED Medications  ipratropium-albuterol (DUONEB) 0.5-2.5 (3) MG/3ML nebulizer solution 3 mL (has no administration in time range)  predniSONE (DELTASONE) tablet 40 mg (has no administration in time range)    ED Course/ Medical Decision Making/ A&P                           Medical Decision Making Risk Prescription drug management.   Patient here for evaluation of shortness of breath and mid back pain.  Symptoms present for several days.  Has been using albuterol inhaler without relief.  Had trial of inhaled steroid disc that provided significant relief but has ran out of medication.  On exam, patient well-appearing.  No increased work of breathing.  He has expiratory wheezes throughout on exam.  Likely exacerbation of COPD/asthma.  Will provide steroid and albuterol neb and reassess.  X-ray without acute cardiopulmonary process, labs unremarkable  Patient has received oral steroid and albuterol neb with little improvement.  Still has expiratory wheezes throughout.  He is requesting discharge home.  I recommended repeat albuterol neb but patient refuses stating that he wants to go home.  He is ambulated in the department without hypoxia.  He has albuterol nebulizer at home, agreeable to continue his nebs and I will provide prescription for  oral steroids.  He will follow-up with PCP.  Return precautions were discussed.         Final Clinical Impression(s) / ED Diagnoses Final diagnoses:  Moderate asthma with exacerbation, unspecified whether persistent    Rx / DC Orders ED Discharge Orders     None         Pauline Aus, PA-C 08/24/21 1623    Franne Forts, DO 08/28/21 2343

## 2021-08-21 NOTE — ED Provider Triage Note (Signed)
Emergency Medicine Provider Triage Evaluation Note  Richard Hampton , a 46 y.o. male  was evaluated in triage.  Pt complains of sob and mid back pain. Hx of asthma, smoking and copd. Breathing worsening for the past 3 months after no refills on LABA. Coughing and wheezing for the past 3 days. + mid back pain worse with coughing and deep breathing. Using albuterol without relief.   Review of Systems  Positive: Cough, wheezing Negative: fever  Physical Exam  BP 124/88   Pulse 78   Temp (!) 97.4 F (36.3 C) (Oral)   Resp 18   SpO2 100%  Gen:   Awake, no distress   Resp:  Wheezing and sob MSK:   Moves extremities without difficulty  Other:  Mid back  pain  Medical Decision Making  Medically screening exam initiated at 5:45 PM.  Appropriate orders placed.  HILDING QUINTANAR was informed that the remainder of the evaluation will be completed by another provider, this initial triage assessment does not replace that evaluation, and the importance of remaining in the ED until their evaluation is complete.  Work up initiated   [employer], PA-C 08/21/21 1749

## 2021-08-21 NOTE — ED Notes (Signed)
Ambulated pt in hallway. Pt's O2 sats were 95% on room air prior to ambulation and dropped to 93% during ambulation. Pt tolerated well.

## 2021-08-22 DIAGNOSIS — H21543 Posterior synechiae (iris), bilateral: Secondary | ICD-10-CM | POA: Diagnosis not present

## 2021-08-22 DIAGNOSIS — H3581 Retinal edema: Secondary | ICD-10-CM | POA: Diagnosis not present

## 2021-08-22 DIAGNOSIS — H209 Unspecified iridocyclitis: Secondary | ICD-10-CM | POA: Diagnosis not present

## 2021-08-23 ENCOUNTER — Emergency Department (HOSPITAL_COMMUNITY)
Admission: EM | Admit: 2021-08-23 | Discharge: 2021-08-23 | Discharge status: Against Medical Advice | Payer: Medicare Other | Attending: Emergency Medicine | Admitting: Emergency Medicine

## 2021-08-23 ENCOUNTER — Encounter (HOSPITAL_COMMUNITY): Payer: Self-pay

## 2021-08-23 DIAGNOSIS — Z5321 Procedure and treatment not carried out due to patient leaving prior to being seen by health care provider: Secondary | ICD-10-CM | POA: Insufficient documentation

## 2021-08-23 DIAGNOSIS — R0602 Shortness of breath: Secondary | ICD-10-CM | POA: Diagnosis not present

## 2021-08-23 DIAGNOSIS — J45909 Unspecified asthma, uncomplicated: Secondary | ICD-10-CM | POA: Insufficient documentation

## 2021-08-23 NOTE — ED Triage Notes (Signed)
Pov from home. Returns for cc of SOB that has been ongoing. Was unable to get his scripts filled from prior visits. Says he got too hot and it triggered his asthma..  supposed to follow up with his pcp on the 30th. That is the closest available.

## 2021-08-29 DIAGNOSIS — F17210 Nicotine dependence, cigarettes, uncomplicated: Secondary | ICD-10-CM | POA: Diagnosis not present

## 2021-08-29 DIAGNOSIS — J45909 Unspecified asthma, uncomplicated: Secondary | ICD-10-CM | POA: Diagnosis not present

## 2021-08-29 DIAGNOSIS — Z299 Encounter for prophylactic measures, unspecified: Secondary | ICD-10-CM | POA: Diagnosis not present

## 2021-10-23 ENCOUNTER — Emergency Department (HOSPITAL_COMMUNITY)
Admission: EM | Admit: 2021-10-23 | Discharge: 2021-10-23 | Discharge status: Against Medical Advice | Payer: Medicare Other | Attending: Emergency Medicine | Admitting: Emergency Medicine

## 2021-10-23 ENCOUNTER — Emergency Department (HOSPITAL_COMMUNITY): Payer: Medicare Other

## 2021-10-23 ENCOUNTER — Other Ambulatory Visit: Payer: Self-pay

## 2021-10-23 ENCOUNTER — Encounter (HOSPITAL_COMMUNITY): Payer: Self-pay | Admitting: Emergency Medicine

## 2021-10-23 ENCOUNTER — Ambulatory Visit (HOSPITAL_COMMUNITY)
Admission: RE | Admit: 2021-10-23 | Discharge: 2021-10-23 | Disposition: A | Discharge status: Home / Self Care | Payer: Medicare Other | Source: Ambulatory Visit | Attending: Emergency Medicine | Admitting: Emergency Medicine

## 2021-10-23 DIAGNOSIS — R109 Unspecified abdominal pain: Secondary | ICD-10-CM

## 2021-10-23 DIAGNOSIS — M25552 Pain in left hip: Secondary | ICD-10-CM | POA: Diagnosis not present

## 2021-10-23 DIAGNOSIS — R0602 Shortness of breath: Secondary | ICD-10-CM | POA: Diagnosis not present

## 2021-10-23 DIAGNOSIS — J45909 Unspecified asthma, uncomplicated: Secondary | ICD-10-CM | POA: Diagnosis not present

## 2021-10-23 DIAGNOSIS — M79662 Pain in left lower leg: Secondary | ICD-10-CM | POA: Insufficient documentation

## 2021-10-23 DIAGNOSIS — R1032 Left lower quadrant pain: Secondary | ICD-10-CM | POA: Diagnosis not present

## 2021-10-23 DIAGNOSIS — F17200 Nicotine dependence, unspecified, uncomplicated: Secondary | ICD-10-CM | POA: Diagnosis not present

## 2021-10-23 DIAGNOSIS — R0789 Other chest pain: Secondary | ICD-10-CM | POA: Diagnosis not present

## 2021-10-23 DIAGNOSIS — Z7952 Long term (current) use of systemic steroids: Secondary | ICD-10-CM | POA: Diagnosis not present

## 2021-10-23 DIAGNOSIS — R079 Chest pain, unspecified: Secondary | ICD-10-CM | POA: Diagnosis not present

## 2021-10-23 DIAGNOSIS — M79605 Pain in left leg: Secondary | ICD-10-CM | POA: Diagnosis not present

## 2021-10-23 DIAGNOSIS — Z5329 Procedure and treatment not carried out because of patient's decision for other reasons: Secondary | ICD-10-CM | POA: Diagnosis not present

## 2021-10-23 LAB — CBC
HCT: 46.7 % (ref 39.0–52.0)
Hemoglobin: 15.3 g/dL (ref 13.0–17.0)
MCH: 27.4 pg (ref 26.0–34.0)
MCHC: 32.8 g/dL (ref 30.0–36.0)
MCV: 83.7 fL (ref 80.0–100.0)
Platelets: 267 10*3/uL (ref 150–400)
RBC: 5.58 MIL/uL (ref 4.22–5.81)
RDW: 14.7 % (ref 11.5–15.5)
WBC: 8.2 10*3/uL (ref 4.0–10.5)
nRBC: 0 % (ref 0.0–0.2)

## 2021-10-23 LAB — BASIC METABOLIC PANEL
Anion gap: 10 (ref 5–15)
BUN: 17 mg/dL (ref 6–20)
CO2: 25 mmol/L (ref 22–32)
Calcium: 9.3 mg/dL (ref 8.9–10.3)
Chloride: 103 mmol/L (ref 98–111)
Creatinine, Ser: 0.89 mg/dL (ref 0.61–1.24)
GFR, Estimated: >60 mL/min (ref >60)
Glucose, Bld: 104 mg/dL — ABNORMAL HIGH (ref 70–99)
Potassium: 3.6 mmol/L (ref 3.5–5.1)
Sodium: 138 mmol/L (ref 135–145)

## 2021-10-23 LAB — TROPONIN I (HIGH SENSITIVITY)
Troponin I (High Sensitivity): 3 ng/L (ref <18)
Troponin I (High Sensitivity): 3 ng/L (ref <18)

## 2021-10-23 MED ORDER — FENTANYL CITRATE PF 50 MCG/ML IJ SOSY: 50.0000 ug | PREFILLED_SYRINGE | Freq: Once | INTRAMUSCULAR | Status: DC

## 2021-10-23 MED ORDER — IOHEXOL 350 MG/ML SOLN: 100.0000 mL | Freq: Once | INTRAVENOUS | Status: DC | PRN

## 2021-10-23 NOTE — ED Notes (Signed)
Pt came out of his room and stated "I'm leaving, I'm sitting her like a god-damn christmas [place]." I asked pt if he could sign a against medical advise form and he said "no" in a loud voice. Dr. Charm Barges made aware.

## 2021-10-23 NOTE — ED Triage Notes (Signed)
Pt presents with mid chest pain radiating left leg starting this afternoon while sitting and watching tv.

## 2021-10-23 NOTE — ED Provider Notes (Signed)
[facility] EMERGENCY DEPARTMENT Provider Note   CSN: [id number] Arrival date & time: 10/23/21  1749     History {Add pertinent medical, surgical, social history, OB history to HPI:1} Chief Complaint  Patient presents with   Chest Pain    Richard Hampton is a 46 y.o. male.  He has a history of asthma.  He is complaining of some left-sided chest abdomen pain into his left leg that started this afternoon while watching TV.  He said it is sharp and also pressure.  Not associated with any nausea or vomiting no urinary symptoms.  No numbness or weakness.  Denies ever having had it before.  Recalls no trauma.  He said he is always short of breath from his asthma and his smoking no change.  No fevers or chills.  The history is provided by the patient.  Chest Pain Pain location:  L lateral chest Pain quality: pressure and sharp   Radiates to: Left abdomen left leg. Pain severity:  Severe Onset quality:  Gradual Timing:  Constant Progression:  Unchanged Chronicity:  New Context: movement   Relieved by:  Nothing Worsened by:  Movement Ineffective treatments:  None tried Associated symptoms: abdominal pain and shortness of breath   Associated symptoms: no cough, no fever, no nausea, no vomiting and no weakness   Risk factors: smoking        Home Medications Prior to Admission medications   Medication Sig Start Date End Date Taking? Authorizing Provider  gabapentin (NEURONTIN) 100 MG capsule Take 1 capsule (100 mg total) by mouth 3 (three) times daily. 05/06/21 06/05/21  Eber Hong, MD  naproxen (NAPROSYN) 500 MG tablet Take 1 tablet (500 mg total) by mouth 2 (two) times daily with a meal. 05/06/21   Eber Hong, MD  predniSONE (DELTASONE) 20 MG tablet Take 2 tablets (40 mg total) by mouth daily. 08/21/21   Triplett, Tammy, PA-C      Allergies    Penicillins    Review of Systems   Review of Systems  Constitutional:  Negative for fever.  Eyes:  Negative for visual disturbance.   Respiratory:  Positive for shortness of breath. Negative for cough.   Cardiovascular:  Positive for chest pain.  Gastrointestinal:  Positive for abdominal pain. Negative for nausea and vomiting.  Genitourinary:  Negative for dysuria.  Skin:  Negative for rash.  Neurological:  Negative for weakness.    Physical Exam Updated Vital Signs BP 116/75   Pulse 84   Temp 97.9 F (36.6 C) (Oral)   Resp (!) 24   Ht 6\' 3"  (1.905 m)   Wt 106.6 kg   SpO2 93%   BMI 29.37 kg/m  Physical Exam Vitals and nursing note reviewed.  Constitutional:      General: He is not in acute distress.    Appearance: Normal appearance. He is well-developed.  HENT:     Head: Normocephalic and atraumatic.  Eyes:     Conjunctiva/sclera: Conjunctivae normal.  Cardiovascular:     Rate and Rhythm: Normal rate and regular rhythm.     Heart sounds: Normal heart sounds. No murmur heard. Pulmonary:     Effort: Pulmonary effort is normal. No respiratory distress.     Breath sounds: Normal breath sounds.  Abdominal:     Palpations: Abdomen is soft.     Tenderness: There is no abdominal tenderness.  Musculoskeletal:        General: No swelling. Normal range of motion.     Cervical back: Neck  supple.     Left lower leg: Tenderness (left hip) present.  Skin:    General: Skin is warm and dry.     Capillary Refill: Capillary refill takes less than 2 seconds.  Neurological:     General: No focal deficit present.     Mental Status: He is alert.     Sensory: No sensory deficit.     Motor: No weakness.  Psychiatric:        Mood and Affect: Mood normal.     ED Results / Procedures / Treatments   Labs (all labs ordered are listed, but only abnormal results are displayed) Labs Reviewed  BASIC METABOLIC PANEL - Abnormal; Notable for the following components:      Result Value   Glucose, Bld 104 (*)    All other components within normal limits  CBC  TROPONIN I (HIGH SENSITIVITY)  TROPONIN I (HIGH SENSITIVITY)     EKG EKG Interpretation  Date/Time:  Monday October 23 2021 21:47:47 EDT Ventricular Rate:  86 PR Interval:  183 QRS Duration: 103 QT Interval:  369 QTC Calculation: 442 R Axis:   102 Text Interpretation: Sinus rhythm Consider right ventricular hypertrophy No significant change since prior 6/21 Confirmed by Meridee Score [phone]) on 10/23/2021 9:58:29 PM  Radiology DG Chest 2 View  Result Date: 10/23/2021 CLINICAL DATA:  Chest pain radiating to the left leg. History of asthma and COPD. EXAM: CHEST - 2 VIEW COMPARISON:  09/23/2019 FINDINGS: Heart size and pulmonary vascularity are normal. Mild peribronchial thickening compatible with history of asthma. No airspace disease or consolidation. No pleural effusions. No pneumothorax. Mediastinal contours appear intact. IMPRESSION: Peribronchial thickening compatible with history of asthma. No focal consolidation. Electronically Signed   By: Burman Nieves M.D.   On: 10/23/2021 18:52    Procedures Procedures  {Document cardiac monitor, telemetry assessment procedure when appropriate:1}  Medications Ordered in ED Medications  fentaNYL (SUBLIMAZE) injection 50 mcg (has no administration in time range)    ED Course/ Medical Decision Making/ A&P                           Medical Decision Making Amount and/or Complexity of Data Reviewed Labs: ordered. Radiology: ordered.  Risk Prescription drug management.   This patient complains of ***; this involves an extensive number of treatment Options and is a complaint that carries with it a high risk of complications and morbidity. The differential includes ***  I ordered, reviewed and interpreted labs, which included *** I ordered medication *** and reviewed PMP when indicated. I ordered imaging studies which included *** and I independently    visualized and interpreted imaging which showed *** Additional history obtained from *** Previous records obtained and reviewed *** I  consulted *** and discussed lab and imaging findings and discussed disposition.  Cardiac monitoring reviewed, *** Social determinants considered, *** Critical Interventions: ***  After the interventions stated above, I reevaluated the patient and found *** Admission and further testing considered, ***   {Document critical care time when appropriate:1} {Document review of labs and clinical decision tools ie heart score, Chads2Vasc2 etc:1}  {Document your independent review of radiology images, and any outside records:1} {Document your discussion with family members, caretakers, and with consultants:1} {Document social determinants of health affecting pt's care:1} {Document your decision making why or why not admission, treatments were needed:1} Final Clinical Impression(s) / ED Diagnoses Final diagnoses:  None    Rx / DC Orders ED  Discharge Orders     None

## 2021-12-13 DIAGNOSIS — E7800 Pure hypercholesterolemia, unspecified: Secondary | ICD-10-CM | POA: Diagnosis not present

## 2021-12-13 DIAGNOSIS — Z79899 Other long term (current) drug therapy: Secondary | ICD-10-CM | POA: Diagnosis not present

## 2021-12-13 DIAGNOSIS — J449 Chronic obstructive pulmonary disease, unspecified: Secondary | ICD-10-CM | POA: Diagnosis not present

## 2021-12-13 DIAGNOSIS — Z6829 Body mass index (BMI) 29.0-29.9, adult: Secondary | ICD-10-CM | POA: Diagnosis not present

## 2021-12-13 DIAGNOSIS — F17210 Nicotine dependence, cigarettes, uncomplicated: Secondary | ICD-10-CM | POA: Diagnosis not present

## 2021-12-13 DIAGNOSIS — R5383 Other fatigue: Secondary | ICD-10-CM | POA: Diagnosis not present

## 2021-12-13 DIAGNOSIS — Z7189 Other specified counseling: Secondary | ICD-10-CM | POA: Diagnosis not present

## 2021-12-13 DIAGNOSIS — Z1331 Encounter for screening for depression: Secondary | ICD-10-CM | POA: Diagnosis not present

## 2021-12-13 DIAGNOSIS — Z299 Encounter for prophylactic measures, unspecified: Secondary | ICD-10-CM | POA: Diagnosis not present

## 2021-12-13 DIAGNOSIS — Z0000 Encounter for general adult medical examination without abnormal findings: Secondary | ICD-10-CM | POA: Diagnosis not present

## 2021-12-22 DIAGNOSIS — Z8261 Family history of arthritis: Secondary | ICD-10-CM | POA: Diagnosis not present

## 2021-12-22 DIAGNOSIS — J42 Unspecified chronic bronchitis: Secondary | ICD-10-CM | POA: Diagnosis not present

## 2021-12-22 DIAGNOSIS — Z8701 Personal history of pneumonia (recurrent): Secondary | ICD-10-CM | POA: Diagnosis not present

## 2021-12-25 DIAGNOSIS — E781 Pure hyperglyceridemia: Secondary | ICD-10-CM | POA: Diagnosis not present

## 2021-12-25 DIAGNOSIS — Z299 Encounter for prophylactic measures, unspecified: Secondary | ICD-10-CM | POA: Diagnosis not present

## 2021-12-25 DIAGNOSIS — Z6828 Body mass index (BMI) 28.0-28.9, adult: Secondary | ICD-10-CM | POA: Diagnosis not present

## 2021-12-25 DIAGNOSIS — F17210 Nicotine dependence, cigarettes, uncomplicated: Secondary | ICD-10-CM | POA: Diagnosis not present

## 2021-12-25 DIAGNOSIS — J449 Chronic obstructive pulmonary disease, unspecified: Secondary | ICD-10-CM | POA: Diagnosis not present

## 2022-03-17 DIAGNOSIS — R030 Elevated blood-pressure reading, without diagnosis of hypertension: Secondary | ICD-10-CM | POA: Diagnosis not present

## 2022-03-17 DIAGNOSIS — R0602 Shortness of breath: Secondary | ICD-10-CM | POA: Diagnosis not present

## 2022-03-17 DIAGNOSIS — Z6829 Body mass index (BMI) 29.0-29.9, adult: Secondary | ICD-10-CM | POA: Diagnosis not present

## 2022-03-17 DIAGNOSIS — E663 Overweight: Secondary | ICD-10-CM | POA: Diagnosis not present

## 2022-03-27 DIAGNOSIS — K219 Gastro-esophageal reflux disease without esophagitis: Secondary | ICD-10-CM | POA: Diagnosis not present

## 2022-03-27 DIAGNOSIS — Z299 Encounter for prophylactic measures, unspecified: Secondary | ICD-10-CM | POA: Diagnosis not present

## 2022-03-27 DIAGNOSIS — E7800 Pure hypercholesterolemia, unspecified: Secondary | ICD-10-CM | POA: Diagnosis not present

## 2022-05-19 ENCOUNTER — Emergency Department (HOSPITAL_COMMUNITY): Payer: Medicare HMO

## 2022-05-19 ENCOUNTER — Encounter (HOSPITAL_COMMUNITY): Payer: Self-pay | Admitting: Emergency Medicine

## 2022-05-19 ENCOUNTER — Emergency Department (HOSPITAL_COMMUNITY)
Admission: EM | Admit: 2022-05-19 | Discharge: 2022-05-19 | Disposition: A | Discharge status: Home / Self Care | Payer: Medicare HMO | Attending: Emergency Medicine | Admitting: Emergency Medicine

## 2022-05-19 ENCOUNTER — Other Ambulatory Visit: Payer: Self-pay

## 2022-05-19 DIAGNOSIS — F1010 Alcohol abuse, uncomplicated: Secondary | ICD-10-CM | POA: Diagnosis not present

## 2022-05-19 DIAGNOSIS — S0990XA Unspecified injury of head, initial encounter: Secondary | ICD-10-CM | POA: Diagnosis not present

## 2022-05-19 DIAGNOSIS — R55 Syncope and collapse: Secondary | ICD-10-CM | POA: Diagnosis not present

## 2022-05-19 DIAGNOSIS — R4182 Altered mental status, unspecified: Secondary | ICD-10-CM | POA: Diagnosis not present

## 2022-05-19 DIAGNOSIS — W19XXXA Unspecified fall, initial encounter: Secondary | ICD-10-CM | POA: Insufficient documentation

## 2022-05-19 DIAGNOSIS — Y908 Blood alcohol level of 240 mg/100 ml or more: Secondary | ICD-10-CM | POA: Insufficient documentation

## 2022-05-19 DIAGNOSIS — R1111 Vomiting without nausea: Secondary | ICD-10-CM | POA: Diagnosis not present

## 2022-05-19 DIAGNOSIS — R9431 Abnormal electrocardiogram [ECG] [EKG]: Secondary | ICD-10-CM | POA: Diagnosis not present

## 2022-05-19 DIAGNOSIS — R531 Weakness: Secondary | ICD-10-CM | POA: Diagnosis not present

## 2022-05-19 LAB — COMPREHENSIVE METABOLIC PANEL
ALT: 46 U/L — ABNORMAL HIGH (ref 0–44)
AST: 41 U/L (ref 15–41)
Albumin: 4.3 g/dL (ref 3.5–5.0)
Alkaline Phosphatase: 69 U/L (ref 38–126)
Anion gap: 10 (ref 5–15)
BUN: 15 mg/dL (ref 6–20)
CO2: 26 mmol/L (ref 22–32)
Calcium: 8.7 mg/dL — ABNORMAL LOW (ref 8.9–10.3)
Chloride: 97 mmol/L — ABNORMAL LOW (ref 98–111)
Creatinine, Ser: 0.91 mg/dL (ref 0.61–1.24)
GFR, Estimated: >60 mL/min (ref >60)
Glucose, Bld: 96 mg/dL (ref 70–99)
Potassium: 3.6 mmol/L (ref 3.5–5.1)
Sodium: 133 mmol/L — ABNORMAL LOW (ref 135–145)
Total Bilirubin: 0.5 mg/dL (ref 0.3–1.2)
Total Protein: 7.3 g/dL (ref 6.5–8.1)

## 2022-05-19 LAB — CBC WITH DIFFERENTIAL/PLATELET
Abs Immature Granulocytes: 0.03 10*3/uL (ref 0.00–0.07)
Basophils Absolute: 0.1 10*3/uL (ref 0.0–0.1)
Basophils Relative: 1 %
Eosinophils Absolute: 0.6 10*3/uL — ABNORMAL HIGH (ref 0.0–0.5)
Eosinophils Relative: 6 %
HCT: 43.5 % (ref 39.0–52.0)
Hemoglobin: 14.3 g/dL (ref 13.0–17.0)
Immature Granulocytes: 0 %
Lymphocytes Relative: 29 %
Lymphs Abs: 3 10*3/uL (ref 0.7–4.0)
MCH: 27.6 pg (ref 26.0–34.0)
MCHC: 32.9 g/dL (ref 30.0–36.0)
MCV: 83.8 fL (ref 80.0–100.0)
Monocytes Absolute: 1.1 10*3/uL — ABNORMAL HIGH (ref 0.1–1.0)
Monocytes Relative: 11 %
Neutro Abs: 5.5 10*3/uL (ref 1.7–7.7)
Neutrophils Relative %: 53 %
Platelets: 238 10*3/uL (ref 150–400)
RBC: 5.19 MIL/uL (ref 4.22–5.81)
RDW: 14.3 % (ref 11.5–15.5)
WBC: 10.2 10*3/uL (ref 4.0–10.5)
nRBC: 0 % (ref 0.0–0.2)

## 2022-05-19 LAB — ETHANOL: Alcohol, Ethyl (B): 260 mg/dL — ABNORMAL HIGH (ref <10)

## 2022-05-19 MED ORDER — SODIUM CHLORIDE 0.9 % IV BOLUS
1000.0000 mL | Freq: Once | INTRAVENOUS | Status: AC
  Administered 2022-05-19: 1000 mL via INTRAVENOUS

## 2022-05-19 MED ORDER — ONDANSETRON HCL 4 MG/2ML IJ SOLN
4.0000 mg | Freq: Once | INTRAMUSCULAR | Status: AC
  Administered 2022-05-19: 4 mg via INTRAVENOUS
  Filled 2022-05-19: qty 2

## 2022-05-19 MED ORDER — NALOXONE HCL 0.4 MG/ML IJ SOLN: 1.0000 mg | Freq: Once | INTRAMUSCULAR | Status: DC

## 2022-05-19 NOTE — ED Provider Notes (Signed)
[facility] EMERGENCY DEPARTMENT AT [facility] Provider Note   CSN: [id number] Arrival date & time: 05/19/22  1820     History  Chief Complaint  Patient presents with   Loss of Consciousness    Richard Hampton is a 47 y.o. male.  Patient has been drinking alcohol and fell and hit his head.  He had his daughter call EMS and they brought him to the emergency department.  Patient is lethargic.  Patient also vomited in the emergency department  The history is provided by the patient and the EMS personnel. No language interpreter was used.  Loss of Consciousness Episode history:  Single Timing:  Intermittent Progression:  Waxing and waning Chronicity:  New Context: not blood draw   Witnessed: yes   Relieved by:  Nothing Worsened by:  Nothing Associated symptoms: no anxiety        Home Medications Prior to Admission medications   Medication Sig Start Date End Date Taking? Authorizing Provider  gabapentin (NEURONTIN) 100 MG capsule Take 1 capsule (100 mg total) by mouth 3 (three) times daily. 05/06/21 06/05/21  Eber Hong, MD  naproxen (NAPROSYN) 500 MG tablet Take 1 tablet (500 mg total) by mouth 2 (two) times daily with a meal. 05/06/21   Eber Hong, MD  predniSONE (DELTASONE) 20 MG tablet Take 2 tablets (40 mg total) by mouth daily. 08/21/21   Triplett, Tammy, PA-C      Allergies    Penicillins    Review of Systems   Review of Systems  Unable to perform ROS: Mental status change  Cardiovascular:  Positive for syncope.    Physical Exam Updated Vital Signs Ht 6\' 3"  (1.905 m)   Wt 120.2 kg   BMI 33.12 kg/m  Physical Exam Vitals and nursing note reviewed.  Constitutional:      Appearance: He is well-developed.     Comments: Patient is arousable to painful stimuli  HENT:     Head: Normocephalic.     Nose: Nose normal.  Eyes:     General: No scleral icterus.    Conjunctiva/sclera: Conjunctivae normal.  Neck:     Thyroid: No thyromegaly.   Cardiovascular:     Rate and Rhythm: Normal rate and regular rhythm.     Heart sounds: No murmur heard.    No friction rub. No gallop.  Pulmonary:     Breath sounds: No stridor. No wheezing or rales.  Chest:     Chest wall: No tenderness.  Abdominal:     General: There is no distension.     Tenderness: There is no abdominal tenderness. There is no rebound.  Musculoskeletal:        General: Normal range of motion.     Cervical back: Neck supple.  Lymphadenopathy:     Cervical: No cervical adenopathy.  Skin:    Findings: No erythema or rash.  Neurological:     Motor: No abnormal muscle tone.     Coordination: Coordination normal.     Comments: Patient is oriented to person only with painful stimuli     ED Results / Procedures / Treatments   Labs (all labs ordered are listed, but only abnormal results are displayed) Labs Reviewed  CBC WITH DIFFERENTIAL/PLATELET - Abnormal; Notable for the following components:      Result Value   Monocytes Absolute 1.1 (*)    Eosinophils Absolute 0.6 (*)    All other components within normal limits  ETHANOL - Abnormal; Notable for the following components:  Alcohol, Ethyl (B) 260 (*)    All other components within normal limits  COMPREHENSIVE METABOLIC PANEL  RAPID URINE DRUG SCREEN, HOSP PERFORMED    EKG None  Radiology DG Chest Port 1 View  Result Date: 05/19/2022 CLINICAL DATA:  Weakness EXAM: PORTABLE CHEST 1 VIEW COMPARISON:  10/23/2021 FINDINGS: Single frontal view of the chest demonstrates an unremarkable cardiac silhouette. No airspace disease, effusion, or pneumothorax. No acute bony abnormalities. IMPRESSION: 1. No acute intrathoracic process. Electronically Signed   By: Sharlet Salina M.D.   On: 05/19/2022 18:49    Procedures Procedures    Medications Ordered in ED Medications  naloxone Correct [facility] Of ) injection 1 mg (has no administration in time range)  sodium chloride 0.9 % bolus 1,000 mL (0 mLs Intravenous Stopped  05/19/22 1903)  ondansetron (ZOFRAN) injection 4 mg (4 mg Intravenous Given 05/19/22 1848)    ED Course/ Medical Decision Making/ A&P     CRITICAL CARE Performed by: Bethann Berkshire Total critical care time: 35 minutes Critical care time was exclusive of separately billable procedures and treating other patients. Critical care was necessary to treat or prevent imminent or life-threatening deterioration. Critical care was time spent personally by me on the following activities: development of treatment plan with patient and/or surrogate as well as nursing, discussions with consultants, evaluation of patient's response to treatment, examination of patient, obtaining history from patient or surrogate, ordering and performing treatments and interventions, ordering and review of laboratory studies, ordering and review of radiographic studies, pulse oximetry and re-evaluation of patient's condition.          Patient awoke in the emergency department and was alert oriented to person.  He stated he did not want to stay and he nurses and I explained that he needed to be evaluated for the significant alcohol intake and the fall.  The patient understood.  I explained that he might have a significant injury to your head.  Patient refused to stay and be evaluated and left AMA                           Medical Decision Making Amount and/or Complexity of Data Reviewed Labs: ordered. Radiology: ordered. ECG/medicine tests: ordered.  Risk Prescription drug management.   EtOH abuse of fall along with fall.  Patient left AMA        Final Clinical Impression(s) / ED Diagnoses Final diagnoses:  ETOH abuse    Rx / DC Orders ED Discharge Orders     None         Bethann Berkshire, MD 05/20/22 1151

## 2022-05-19 NOTE — ED Notes (Signed)
Patient awoke and became very agitated. Patient heard screaming from nurses station. Upon this tech, RN, and security entering room, patient screaming wanting to know where he was and what was going on. RN and tech tried to explain what brought him here. Patient discontinued his own IV and starts tearing off leads at this time. Patient standing at the foot of the bed insisting to leave. [employer] and security at bedside. Patient asking for everyone to leave his room. EDP notified.

## 2022-05-19 NOTE — ED Triage Notes (Signed)
Patient brought in via EMS from home. Patient unresponsive upon arrival to ED after vomiting while being rolled through EMS bay. Dr Roderic Palau called to room immediately. Patient responsive to sternal rub. Paramedic called out originally for for syncopal episode with fall, hitting head on bathroom counter. Orbital swelling noted with small lac, no active bleeding noted. Patient given 68m of Narcan by paramedic at 1754. Patient became responsive, vomited shortly after. Patient reports drinking a total of 48 cans of beer today. Denies any illegal substance abuse. Patient given 444mof Zofran IV in route at 1810. Patient's CBG 84 per paramedic.

## 2022-05-19 NOTE — ED Notes (Signed)
Patient became irate while Probation officer was not in room. Patient heard screaming from desk.Security at bedside. Patient dc'd his IV, disconnected himself from monitor and stood at the foot of the bed. MD aware of patients AMA status.

## 2022-05-31 ENCOUNTER — Encounter: Payer: Self-pay | Admitting: Radiology

## 2022-07-19 DIAGNOSIS — M7712 Lateral epicondylitis, left elbow: Secondary | ICD-10-CM | POA: Diagnosis not present

## 2022-07-19 DIAGNOSIS — Z299 Encounter for prophylactic measures, unspecified: Secondary | ICD-10-CM | POA: Diagnosis not present

## 2022-07-19 DIAGNOSIS — R52 Pain, unspecified: Secondary | ICD-10-CM | POA: Diagnosis not present

## 2022-10-09 ENCOUNTER — Other Ambulatory Visit: Payer: Self-pay

## 2022-10-09 ENCOUNTER — Encounter (HOSPITAL_COMMUNITY): Payer: Self-pay | Admitting: *Deleted

## 2022-10-09 ENCOUNTER — Emergency Department (HOSPITAL_COMMUNITY)
Admission: EM | Admit: 2022-10-09 | Discharge: 2022-10-09 | Disposition: A | Discharge status: Home / Self Care | Payer: Medicare HMO | Source: Home / Self Care | Attending: Student | Admitting: Student

## 2022-10-09 DIAGNOSIS — L03114 Cellulitis of left upper limb: Secondary | ICD-10-CM | POA: Diagnosis not present

## 2022-10-09 DIAGNOSIS — W57XXXA Bitten or stung by nonvenomous insect and other nonvenomous arthropods, initial encounter: Secondary | ICD-10-CM | POA: Insufficient documentation

## 2022-10-09 DIAGNOSIS — J449 Chronic obstructive pulmonary disease, unspecified: Secondary | ICD-10-CM | POA: Insufficient documentation

## 2022-10-09 DIAGNOSIS — F17210 Nicotine dependence, cigarettes, uncomplicated: Secondary | ICD-10-CM | POA: Diagnosis not present

## 2022-10-09 DIAGNOSIS — M79632 Pain in left forearm: Secondary | ICD-10-CM | POA: Diagnosis present

## 2022-10-09 MED ORDER — SULFAMETHOXAZOLE-TRIMETHOPRIM 800-160 MG PO TABS: 1.0000 | ORAL_TABLET | Freq: Two times a day (BID) | ORAL | 0 refills | Status: AC

## 2022-10-09 MED ORDER — SULFAMETHOXAZOLE-TRIMETHOPRIM 800-160 MG PO TABS
1.0000 | ORAL_TABLET | Freq: Once | ORAL | Status: AC
Start: 2022-10-09 — End: 2022-10-09
  Administered 2022-10-09: 1 via ORAL
  Filled 2022-10-09: qty 1

## 2022-10-09 MED ORDER — KETOROLAC TROMETHAMINE 15 MG/ML IJ SOLN
15.0000 mg | Freq: Once | INTRAMUSCULAR | Status: AC
  Administered 2022-10-09: 15 mg via INTRAMUSCULAR
  Filled 2022-10-09: qty 1

## 2022-10-09 MED ORDER — NAPROXEN 375 MG PO TABS: 375.0000 mg | ORAL_TABLET | Freq: Two times a day (BID) | ORAL | 0 refills | Status: AC

## 2022-10-09 NOTE — ED Triage Notes (Signed)
Pt states he was mowing a yard just prior to arrival and noticed he had swelling to left forearm  No bite mark noted, pt has swelling and some redness

## 2022-10-09 NOTE — ED Provider Notes (Signed)
[facility] EMERGENCY DEPARTMENT AT [facility] Provider Note  CSN: [id number] Arrival date & time: 10/09/22 0830  Chief Complaint(s) Insect Bite  HPI Richard Hampton is a 47 y.o. male with PMH COPD, previous pneumothorax who presents emergency department for evaluation of left forearm pain.  Patient states that he was mowing this morning and he felt progressive worsening pain over the brachial radialis on the left with erythema and swelling.  He is concerned that something may have bit him.  Denies numbness, tingling, weakness or other neurologic complaints.  Denies chest pain, shortness of breath, abdominal pain, urticaria or other systemic complaints.   Past Medical History Past Medical History:  Diagnosis Date   Collapsed lung    right side   COPD (chronic obstructive pulmonary disease) (HCC)    Hyperlipidemia    Patient Active Problem List   Diagnosis Date Noted   H N P-LUMBAR 12/08/2008   LOW BACK PAIN 12/08/2008   SCIATICA 12/08/2008   UNSPECIFIED MONONEURITIS OF UPPER LIMB 06/04/2007   Home Medication(s) Prior to Admission medications   Medication Sig Start Date End Date Taking? Authorizing Provider  gabapentin (NEURONTIN) 100 MG capsule Take 1 capsule (100 mg total) by mouth 3 (three) times daily. 05/06/21 06/05/21  Eber Hong, MD  naproxen (NAPROSYN) 500 MG tablet Take 1 tablet (500 mg total) by mouth 2 (two) times daily with a meal. 05/06/21   Eber Hong, MD  predniSONE (DELTASONE) 20 MG tablet Take 2 tablets (40 mg total) by mouth daily. 08/21/21   Pauline Aus, PA-C                                                                                                                                    Past Surgical History Past Surgical History:  Procedure Laterality Date   SPINE SURGERY     VASECTOMY     Family History Family History  Problem Relation Age of Onset   Asthma Mother     Social History Social History   Tobacco Use   Smoking status: Every  Day    Packs/day: 2    Types: Cigarettes   Smokeless tobacco: Former    Types: Snuff  Vaping Use   Vaping Use: Never used  Substance Use Topics   Alcohol use: Yes    Alcohol/week: 12.0 standard drinks of alcohol    Types: 12 Cans of beer per week   Drug use: No   Allergies Penicillins  Review of Systems Review of Systems  Musculoskeletal:  Positive for myalgias.  Skin:  Positive for rash.    Physical Exam Vital Signs  I have reviewed the triage vital signs BP (!) 141/88 (BP Location: Right Arm)   Pulse 87   Temp 97.8 F (36.6 C) (Oral)   Resp 16   Ht 6\' 3"  (1.905 m)   Wt 111.1 kg   SpO2 96%   BMI 30.62 kg/m  Physical Exam Constitutional:      General: He is not in acute distress.    Appearance: Normal appearance.  HENT:     Head: Normocephalic and atraumatic.     Nose: No congestion or rhinorrhea.  Eyes:     General:        Right eye: No discharge.        Left eye: No discharge.     Extraocular Movements: Extraocular movements intact.     Pupils: Pupils are equal, round, and reactive to light.  Cardiovascular:     Rate and Rhythm: Normal rate and regular rhythm.     Heart sounds: No murmur heard. Pulmonary:     Effort: No respiratory distress.     Breath sounds: No wheezing or rales.  Abdominal:     General: There is no distension.     Tenderness: There is no abdominal tenderness.  Musculoskeletal:        General: Tenderness present. Normal range of motion.     Cervical back: Normal range of motion.  Skin:    General: Skin is warm and dry.     Findings: Erythema present.  Neurological:     General: No focal deficit present.     Mental Status: He is alert.     ED Results and Treatments Labs (all labs ordered are listed, but only abnormal results are displayed) Labs Reviewed - No data to display                                                                                                                        Radiology No results  found.  Pertinent labs & imaging results that were available during my care of the patient were reviewed by me and considered in my medical decision making (see MDM for details).  Medications Ordered in ED Medications  sulfamethoxazole-trimethoprim (BACTRIM DS) 800-160 MG per tablet 1 tablet (has no administration in time range)  ketorolac (TORADOL) 15 MG/ML injection 15 mg (has no administration in time range)                                                                                                                                     Procedures Procedures  (including critical care time)  Medical Decision Making / ED Course   This patient presents to the ED for concern of arm pain swelling, this involves an extensive number  of treatment options, and is a complaint that carries with it a high risk of complications and morbidity.  The differential diagnosis includes cellulitis, insect bite, muscular strain, fracture, ligamentous injury, contusion, hematoma  MDM: Patient seen emergency room for evaluation of forearm pain and swelling.  Physical exam with an area of erythema that is tender to palpation and worse with stretching the skin consistent with developing cellulitis.  Patient reported no true to the forearm and I have lower suspicion for hematoma, contusion or fracture.  Thus x-ray imaging deferred.  Patient does have a history of hives with penicillins and thus we will cover with Bactrim.  Patient given Toradol for pain and he will monitor his symptoms at home.  Return precautions given which she voiced understanding he was discharged with Naprosyn and Bactrim.  Additional history obtained:  -External records from outside source obtained and reviewed including: Chart review including previous notes, labs, imaging, consultation notes    Medicines ordered and prescription drug management: Meds ordered this encounter  Medications   sulfamethoxazole-trimethoprim (BACTRIM DS)  800-160 MG per tablet 1 tablet   ketorolac (TORADOL) 15 MG/ML injection 15 mg    -I have reviewed the patients home medicines and have made adjustments as needed  Critical interventions none  Social Determinants of Health:  Factors impacting patients care include: none   Reevaluation: After the interventions noted above, I reevaluated the patient and found that they have :improved  Co morbidities that complicate the patient evaluation  Past Medical History:  Diagnosis Date   Collapsed lung    right side   COPD (chronic obstructive pulmonary disease) (HCC)    Hyperlipidemia       Dispostion: I considered admission for this patient, but at this time he does not meet inpatient criteria for admission he is safe for discharge with outpatient follow-up     Final Clinical Impression(s) / ED Diagnoses Final diagnoses:  None     @PCDICTATION @    Shawndale Kilpatrick, Wyn Forster, MD 10/09/22 [phone]

## 2022-10-15 ENCOUNTER — Telehealth: Payer: Self-pay

## 2022-10-15 NOTE — Telephone Encounter (Signed)
Transition Care Management Follow-up Telephone Call Date of discharge and from where: 10/09/2022 [facility] How have you been since you were released from the hospital? Patient stated that he is not feeling any better. Any questions or concerns? No  Items Reviewed: Did the pt receive and understand the discharge instructions provided? Yes  Medications obtained and verified? Yes  Other? No  Any new allergies since your discharge? No  Dietary orders reviewed? Yes Do you have support at home? Yes   Follow up appointments reviewed:  PCP Hospital f/u appt confirmed? Yes  Scheduled to see Kirstie Peri, MD on 10/16/2022 @ [facility]. Specialist Hospital f/u appt confirmed? No  Scheduled to see  on  @ . Are transportation arrangements needed? No  If their condition worsens, is the pt aware to call PCP or go to the Emergency Dept.? Yes Was the patient provided with contact information for the PCP's office or ED? Yes Was to pt encouraged to call back with questions or concerns? Yes  [facility]  [facility] Population Health Community Resource Care Guide   ??Richard Hampton  ?? [phone]   Website: [email or web address]

## 2022-10-16 DIAGNOSIS — J449 Chronic obstructive pulmonary disease, unspecified: Secondary | ICD-10-CM | POA: Diagnosis not present

## 2022-10-16 DIAGNOSIS — K219 Gastro-esophageal reflux disease without esophagitis: Secondary | ICD-10-CM | POA: Diagnosis not present

## 2022-10-16 DIAGNOSIS — Z299 Encounter for prophylactic measures, unspecified: Secondary | ICD-10-CM | POA: Diagnosis not present

## 2022-10-16 DIAGNOSIS — L0390 Cellulitis, unspecified: Secondary | ICD-10-CM | POA: Diagnosis not present

## 2022-12-17 DIAGNOSIS — E7800 Pure hypercholesterolemia, unspecified: Secondary | ICD-10-CM | POA: Diagnosis not present

## 2022-12-17 DIAGNOSIS — B029 Zoster without complications: Secondary | ICD-10-CM | POA: Diagnosis not present

## 2022-12-17 DIAGNOSIS — J449 Chronic obstructive pulmonary disease, unspecified: Secondary | ICD-10-CM | POA: Diagnosis not present

## 2022-12-17 DIAGNOSIS — Z79899 Other long term (current) drug therapy: Secondary | ICD-10-CM | POA: Diagnosis not present

## 2022-12-17 DIAGNOSIS — Z0000 Encounter for general adult medical examination without abnormal findings: Secondary | ICD-10-CM | POA: Diagnosis not present

## 2022-12-17 DIAGNOSIS — Z23 Encounter for immunization: Secondary | ICD-10-CM | POA: Diagnosis not present

## 2022-12-17 DIAGNOSIS — Z1331 Encounter for screening for depression: Secondary | ICD-10-CM | POA: Diagnosis not present

## 2022-12-17 DIAGNOSIS — R5383 Other fatigue: Secondary | ICD-10-CM | POA: Diagnosis not present

## 2022-12-17 DIAGNOSIS — Z7189 Other specified counseling: Secondary | ICD-10-CM | POA: Diagnosis not present

## 2022-12-17 DIAGNOSIS — Z1339 Encounter for screening examination for other mental health and behavioral disorders: Secondary | ICD-10-CM | POA: Diagnosis not present

## 2022-12-24 DIAGNOSIS — B029 Zoster without complications: Secondary | ICD-10-CM | POA: Diagnosis not present

## 2022-12-24 DIAGNOSIS — Z299 Encounter for prophylactic measures, unspecified: Secondary | ICD-10-CM | POA: Diagnosis not present

## 2022-12-24 DIAGNOSIS — M25511 Pain in right shoulder: Secondary | ICD-10-CM | POA: Diagnosis not present

## 2022-12-24 DIAGNOSIS — R52 Pain, unspecified: Secondary | ICD-10-CM | POA: Diagnosis not present

## 2022-12-26 DIAGNOSIS — M7581 Other shoulder lesions, right shoulder: Secondary | ICD-10-CM | POA: Diagnosis not present

## 2023-01-01 ENCOUNTER — Ambulatory Visit (HOSPITAL_COMMUNITY): Payer: Medicare HMO | Attending: Orthopedic Surgery | Admitting: Occupational Therapy

## 2023-01-01 ENCOUNTER — Other Ambulatory Visit: Payer: Self-pay

## 2023-01-01 ENCOUNTER — Encounter (HOSPITAL_COMMUNITY): Payer: Self-pay | Admitting: Occupational Therapy

## 2023-01-01 DIAGNOSIS — M25611 Stiffness of right shoulder, not elsewhere classified: Secondary | ICD-10-CM | POA: Insufficient documentation

## 2023-01-01 DIAGNOSIS — R29898 Other symptoms and signs involving the musculoskeletal system: Secondary | ICD-10-CM | POA: Diagnosis not present

## 2023-01-01 DIAGNOSIS — M25511 Pain in right shoulder: Secondary | ICD-10-CM | POA: Diagnosis not present

## 2023-01-01 NOTE — Therapy (Signed)
OUTPATIENT OCCUPATIONAL THERAPY ORTHO EVALUATION  Patient Name: Richard Hampton MRN: [id number] DOB:Apr 19, 1975, 47 y.o., male Today's Date: 01/01/2023   END OF SESSION:  OT End of Session - 01/01/23 1648     Visit Number 1    Number of Visits 8    Date for OT Re-Evaluation 02/01/23    Authorization Type [employer] (requesting 8 visits)    OT Start Time 0933    OT Stop Time 1007    OT Time Calculation (min) 34 min    Activity Tolerance Patient tolerated treatment well    Behavior During Therapy WFL for tasks assessed/performed             Past Medical History:  Diagnosis Date   Collapsed lung    right side   COPD (chronic obstructive pulmonary disease) (HCC)    Hyperlipidemia    Past Surgical History:  Procedure Laterality Date   SPINE SURGERY     VASECTOMY     Patient Active Problem List   Diagnosis Date Noted   H N P-LUMBAR 12/08/2008   LOW BACK PAIN 12/08/2008   SCIATICA 12/08/2008   Mononeuritis of upper limb 06/04/2007    PCP: Kirstie Peri, MD REFERRING PROVIDER: Frederico Hamman, MD  ONSET DATE: ~1 month  REFERRING DIAG: R shoulder pain  THERAPY DIAG:  Right shoulder pain, unspecified chronicity  Shoulder stiffness, right  Other symptoms and signs involving the musculoskeletal system  Rationale for Evaluation and Treatment: Rehabilitation  SUBJECTIVE:   SUBJECTIVE STATEMENT: "It sounds like rice crispies in there" Pt accompanied by: self  PERTINENT HISTORY: Pt reports intermittent pain over the past several years following an accident. Pt reports in the past month his pain has flared up to the worst it has been with radiating pain down his arm starting at the shoulder. Pt is seeing an orthopedic surgeon, no imaging completed at this time.   PRECAUTIONS: None  WEIGHT BEARING RESTRICTIONS: No  PAIN:  Are you having pain? Yes: NPRS scale: 9/10 Pain location: anterior shoulder girdle Pain description: sharp and aching Aggravating  factors: nothing Relieving factors: unsure  FALLS: Has patient fallen in last 6 months? No  PLOF: Independent  PATIENT GOALS: To reduce pain  NEXT MD VISIT: ~ Late October  OBJECTIVE:   HAND DOMINANCE: Right  ADLs: Overall ADLs: Pt reporting having to use his LUE to assist with all dressing and bathing, especially when having to reach his RUE up in flexion or abduction. Lifting causes max pain and difficulty, relying mostly on LUE.   FUNCTIONAL OUTCOME MEASURES: FOTO: 61.12/100  UPPER EXTREMITY ROM:       Assessed in seated, er/IR adducted  Active ROM Right eval  Shoulder flexion 107  Shoulder abduction 91  Shoulder internal rotation 90  Shoulder external rotation 46  (Blank rows = not tested)    UPPER EXTREMITY MMT:     Assessed in seated, er/IR adducted  MMT Right eval  Shoulder flexion 4+/5  Shoulder abduction 4+/5  Shoulder internal rotation 5/5  Shoulder external rotation 4+/5  (Blank rows = not tested)  SENSATION: Tingling in the anterior shoulder girdle  EDEMA: No swelling noted  OBSERVATIONS: moderate fascial restrictions noted in the trapezius, biceps, anterior shoulder girdle and scapular region.   TODAY'S TREATMENT:  DATE: 01/01/23: Evaluation Only   PATIENT EDUCATION: Education details: Will initiate on first session.  Person educated: Patient Education method: Medical illustrator Education comprehension: verbalized understanding  HOME EXERCISE PROGRAM: Will initiate on first session.   GOALS: Goals reviewed with patient? Yes   SHORT TERM GOALS: Target date: 02/01/23  Pt will be provided with and educated on HEP to improve mobility in RUE required for use during ADL completion.   Goal status: INITIAL  Pt will decrease pain in RUE to 3/10 or less to improve ability to sleep for 2+ consecutive hours  without waking due to pain.   Goal status: INITIAL  2.  Pt will decrease RUE fascial restrictions to min amounts or less to improve mobility required for functional reaching tasks.   Goal status: INITIAL  3.  Pt will increase RUE A/ROM by 40 degrees to improve ability to use RUE when reaching overhead or behind back during dressing and bathing tasks.   Goal status: INITIAL  4.  Pt will increase RUE strength to 5/5 or greater to improve ability to use RUE when lifting or carrying items during meal preparation/housework/yardwork tasks.   Goal status: INITIAL  5.  Pt will return to highest level of function using RUE as dominant during functional task completion.   Goal status: INITIAL   ASSESSMENT:  CLINICAL IMPRESSION: Patient is a 47 y.o. male who was seen today for occupational therapy evaluation for R shoulder pain. Pt presents with increased pain and fascial restrictions, decreased ROM, strength, and functional use of the RUE.   PERFORMANCE DEFICITS: in functional skills including in functional skills including ADLs, IADLs, coordination, tone, ROM, strength, pain, fascial restrictions, muscle spasms, and UE functional use.  IMPAIRMENTS: are limiting patient from ADLs, IADLs, rest and sleep, work, leisure, and social participation.   COMORBIDITIES: has no other co-morbidities that affects occupational performance. Patient will benefit from skilled OT to address above impairments and improve overall function.  MODIFICATION OR ASSISTANCE TO COMPLETE EVALUATION: No modification of tasks or assist necessary to complete an evaluation.  OT OCCUPATIONAL PROFILE AND HISTORY: Problem focused assessment: Including review of records relating to presenting problem.  CLINICAL DECISION MAKING: LOW - limited treatment options, no task modification necessary  REHAB POTENTIAL: Good  EVALUATION COMPLEXITY: Low      PLAN:  OT FREQUENCY: 2x/week  OT DURATION: 4 weeks  PLANNED  INTERVENTIONS: self care/ADL training, therapeutic exercise, therapeutic activity, neuromuscular re-education, manual therapy, passive range of motion, splinting, electrical stimulation, ultrasound, moist heat, cryotherapy, patient/family education, and DME and/or AE instructions  RECOMMENDED OTHER SERVICES: N/A  CONSULTED AND AGREED WITH PLAN OF CARE: Patient  PLAN FOR NEXT SESSION: Manual Therapy, AA/ROM, A/ROM, Proximal shoulder strengthening   Trish Mage, OTR/L [facility] [phone] Tariana Moldovan Rosemarie Beath, OT 01/01/2023, 4:50 PM

## 2023-01-10 ENCOUNTER — Ambulatory Visit (HOSPITAL_COMMUNITY): Payer: Medicare HMO | Admitting: Occupational Therapy

## 2023-01-10 ENCOUNTER — Encounter (HOSPITAL_COMMUNITY): Payer: Self-pay | Admitting: Occupational Therapy

## 2023-01-10 DIAGNOSIS — M25611 Stiffness of right shoulder, not elsewhere classified: Secondary | ICD-10-CM | POA: Diagnosis not present

## 2023-01-10 DIAGNOSIS — R29898 Other symptoms and signs involving the musculoskeletal system: Secondary | ICD-10-CM

## 2023-01-10 DIAGNOSIS — M25511 Pain in right shoulder: Secondary | ICD-10-CM

## 2023-01-10 NOTE — Patient Instructions (Signed)
Perform each exercise ____10-15____ reps. 2-3x days.   1) Protraction   Start by holding a wand or cane at chest height.  Next, slowly push the wand outwards in front of your body so that your elbows become fully straightened. Then, return to the original position.     2) Shoulder FLEXION   In the standing position, hold a wand/cane with both arms, palms down on both sides. Raise up the wand/cane allowing your unaffected arm to perform most of the effort. Your affected arm should be partially relaxed.      3) Internal/External ROTATION   In the standing position, hold a wand/cane with both hands keeping your elbows bent. Move your arms and wand/cane to one side.  Your affected arm should be partially relaxed while your unaffected arm performs most of the effort.       4) Shoulder ABDUCTION   While holding a wand/cane palm face up on the injured side and palm face down on the uninjured side, slowly raise up your injured arm to the side.        5) Horizontal Abduction/Adduction      Straight arms holding cane at shoulder height, bring cane to right, center, left. Repeat starting to left.   Copyright  [employer]. All rights reserved.    Repeat all exercises 10-15 times, 1-2 times per day.  1) Shoulder Protraction    Begin with elbows by your side, slowly "punch" straight out in front of you.      2) Shoulder Flexion  Supine:     Standing:         Begin with arms at your side with thumbs pointed up, slowly raise both arms up and forward towards overhead.               3) Horizontal abduction/adduction  Supine:   Standing:           Begin with arms straight out in front of you, bring out to the side in at "T" shape. Keep arms straight entire time.                 4) Internal & External Rotation   Supine:     Standing:     Stand with elbows at the side and elbows bent 90 degrees. Move your forearms away from your body, then  bring back inward toward the body.     5) Shoulder Abduction  Supine:     Standing:       Lying on your back begin with your arms flat on the table next to your side. Slowly move your arms out to the side so that they go overhead, in a jumping jack or snow angel movement.   

## 2023-01-10 NOTE — Therapy (Signed)
OUTPATIENT OCCUPATIONAL THERAPY ORTHO EVALUATION  Patient Name: Richard Hampton MRN: [id number] DOB:[birth year], 47 y.o., male Today's Date: 01/10/2023   END OF SESSION:  OT End of Session - 01/10/23 0808     Visit Number 2    Number of Visits 8    Date for OT Re-Evaluation 02/01/23    Authorization Type [employer] (requesting 8 visits)    Authorization - Visit Number 1    Authorization - Number of Visits 8    OT Start Time [phone]    OT Stop Time 0805    OT Time Calculation (min) 39 min    Activity Tolerance Patient tolerated treatment well    Behavior During Therapy WFL for tasks assessed/performed              Past Medical History:  Diagnosis Date   Collapsed lung    right side   COPD (chronic obstructive pulmonary disease) (HCC)    Hyperlipidemia    Past Surgical History:  Procedure Laterality Date   SPINE SURGERY     VASECTOMY     Patient Active Problem List   Diagnosis Date Noted   H N P-LUMBAR 12/08/2008   LOW BACK PAIN 12/08/2008   SCIATICA 12/08/2008   Mononeuritis of upper limb 06/04/2007    PCP: Kirstie Peri, MD REFERRING PROVIDER: Frederico Hamman, MD  ONSET DATE: ~1 month  REFERRING DIAG: R shoulder pain  THERAPY DIAG:  Right shoulder pain, unspecified chronicity  Shoulder stiffness, right  Other symptoms and signs involving the musculoskeletal system  Rationale for Evaluation and Treatment: Rehabilitation  SUBJECTIVE:   SUBJECTIVE STATEMENT: "I'm weed eating 14 hours a day" Pt accompanied by: self  PERTINENT HISTORY: Pt reports intermittent pain over the past several years following an accident. Pt reports in the past month his pain has flared up to the worst it has been with radiating pain down his arm starting at the shoulder. Pt is seeing an orthopedic surgeon, no imaging completed at this time.   PRECAUTIONS: None  WEIGHT BEARING RESTRICTIONS: No  PAIN:  Are you having pain? Yes: NPRS scale: 5/10 Pain location: anterior  shoulder girdle Pain description: sharp and aching Aggravating factors: nothing Relieving factors: unsure  FALLS: Has patient fallen in last 6 months? No  PLOF: Independent  PATIENT GOALS: To reduce pain  NEXT MD VISIT: ~ Late October  OBJECTIVE:   HAND DOMINANCE: Right  ADLs: Overall ADLs: Pt reporting having to use his LUE to assist with all dressing and bathing, especially when having to reach his RUE up in flexion or abduction. Lifting causes max pain and difficulty, relying mostly on LUE.   FUNCTIONAL OUTCOME MEASURES: FOTO: 61.12/100  UPPER EXTREMITY ROM:       Assessed in seated, er/IR adducted  Active ROM Right eval  Shoulder flexion 107  Shoulder abduction 91  Shoulder internal rotation 90  Shoulder external rotation 46  (Blank rows = not tested)    UPPER EXTREMITY MMT:     Assessed in seated, er/IR adducted  MMT Right eval  Shoulder flexion 4+/5  Shoulder abduction 4+/5  Shoulder internal rotation 5/5  Shoulder external rotation 4+/5  (Blank rows = not tested)  SENSATION: Tingling in the anterior shoulder girdle  EDEMA: No swelling noted  OBSERVATIONS: moderate fascial restrictions noted in the trapezius, biceps, anterior shoulder girdle and scapular region.   TODAY'S TREATMENT:  DATE:   01/10/23 -Manual therapy: myofascial release and trigger point applied to biceps, deltoid, trapezius, and subscapularis in order to reduce pain and fascial restrictions and improve ROM.  -AA/ROM: supine, flexion, abduction, protraction, horizontal abduction, er/IR, x10 -A/ROM: supine, flexion, abduction, protraction, horizontal abduction, er/IR, x10 -Wall Slides: flexion, abduction, x10   PATIENT EDUCATION: Education details: AA/ROM and A/ROM Person educated: Patient Education method: Medical illustrator Education  comprehension: verbalized understanding  HOME EXERCISE PROGRAM: 10/10: AA/ROM and A/ROM  GOALS: Goals reviewed with patient? Yes   SHORT TERM GOALS: Target date: 02/01/23  Pt will be provided with and educated on HEP to improve mobility in RUE required for use during ADL completion.   Goal status: IN PROGRESS  Pt will decrease pain in RUE to 3/10 or less to improve ability to sleep for 2+ consecutive hours without waking due to pain.   Goal status: IN PROGRESS  2.  Pt will decrease RUE fascial restrictions to min amounts or less to improve mobility required for functional reaching tasks.   Goal status: IN PROGRESS  3.  Pt will increase RUE A/ROM by 40 degrees to improve ability to use RUE when reaching overhead or behind back during dressing and bathing tasks.   Goal status: IN PROGRESS  4.  Pt will increase RUE strength to 5/5 or greater to improve ability to use RUE when lifting or carrying items during meal preparation/housework/yardwork tasks.   Goal status: IN PROGRESS  5.  Pt will return to highest level of function using RUE as dominant during functional task completion.   Goal status: IN PROGRESS   ASSESSMENT:  CLINICAL IMPRESSION: This session pt working on improving his ROM and movement pattern. He presented with continued stiffness and discomfort originally, however with increased time and effort he was able to stretch out and reach full ROM both with AA/ROM and A/ROM. As his movement pattern continues to improve OT will add stability work and strengthening to improve his overall pain and mobility. Verbal and tactile cuing provided for positioning and technique throughout session.   PERFORMANCE DEFICITS: in functional skills including in functional skills including ADLs, IADLs, coordination, tone, ROM, strength, pain, fascial restrictions, muscle spasms, and UE functional use.    PLAN:  OT FREQUENCY: 2x/week  OT DURATION: 4 weeks  PLANNED INTERVENTIONS:  self care/ADL training, therapeutic exercise, therapeutic activity, neuromuscular re-education, manual therapy, passive range of motion, splinting, electrical stimulation, ultrasound, moist heat, cryotherapy, patient/family education, and DME and/or AE instructions  RECOMMENDED OTHER SERVICES: N/A  CONSULTED AND AGREED WITH PLAN OF CARE: Patient  PLAN FOR NEXT SESSION: Manual Therapy, AA/ROM, A/ROM, Proximal shoulder strengthening   Trish Mage, OTR/L [facility] [phone] [place] Strege Rosemarie Beath, OT 01/10/2023, 8:09 AM

## 2023-01-11 ENCOUNTER — Encounter (HOSPITAL_COMMUNITY): Payer: Self-pay | Admitting: Occupational Therapy

## 2023-01-11 ENCOUNTER — Ambulatory Visit (HOSPITAL_COMMUNITY): Payer: Medicare HMO | Admitting: Occupational Therapy

## 2023-01-11 DIAGNOSIS — M25611 Stiffness of right shoulder, not elsewhere classified: Secondary | ICD-10-CM | POA: Diagnosis not present

## 2023-01-11 DIAGNOSIS — R29898 Other symptoms and signs involving the musculoskeletal system: Secondary | ICD-10-CM

## 2023-01-11 DIAGNOSIS — M25511 Pain in right shoulder: Secondary | ICD-10-CM | POA: Diagnosis not present

## 2023-01-11 NOTE — Therapy (Signed)
OUTPATIENT OCCUPATIONAL THERAPY ORTHO TREATMENT NOTE  Patient Name: Richard Hampton MRN: [id number] DOB:01/02/1976, 47 y.o., male Today's Date: 01/11/2023   END OF SESSION:  OT End of Session - 01/11/23 0802     Visit Number 3    Number of Visits 8    Date for OT Re-Evaluation 02/01/23    Authorization Type [employer]    Authorization Time Period 8 visits approved (01/04/23-02/01/23)    Authorization - Visit Number 2    Authorization - Number of Visits 8    OT Start Time 0720    OT Stop Time 0801    OT Time Calculation (min) 41 min    Activity Tolerance Patient tolerated treatment well    Behavior During Therapy WFL for tasks assessed/performed               Past Medical History:  Diagnosis Date   Collapsed lung    right side   COPD (chronic obstructive pulmonary disease) (HCC)    Hyperlipidemia    Past Surgical History:  Procedure Laterality Date   SPINE SURGERY     VASECTOMY     Patient Active Problem List   Diagnosis Date Noted   H N P-LUMBAR 12/08/2008   LOW BACK PAIN 12/08/2008   SCIATICA 12/08/2008   Mononeuritis of upper limb 06/04/2007    PCP: Kirstie Peri, MD REFERRING PROVIDER: Frederico Hamman, MD  ONSET DATE: ~1 month  REFERRING DIAG: R shoulder pain  THERAPY DIAG:  Right shoulder pain, unspecified chronicity  Shoulder stiffness, right  Other symptoms and signs involving the musculoskeletal system  Rationale for Evaluation and Treatment: Rehabilitation  SUBJECTIVE:   SUBJECTIVE STATEMENT: "Not feeling too bad today" Pt accompanied by: self  PERTINENT HISTORY: Pt reports intermittent pain over the past several years following an accident. Pt reports in the past month his pain has flared up to the worst it has been with radiating pain down his arm starting at the shoulder. Pt is seeing an orthopedic surgeon, no imaging completed at this time.   PRECAUTIONS: None  WEIGHT BEARING RESTRICTIONS: No  PAIN:  Are you having pain?  Yes: NPRS scale: 5/10 Pain location: anterior shoulder girdle Pain description: sharp and aching Aggravating factors: nothing Relieving factors: unsure  FALLS: Has patient fallen in last 6 months? No  PLOF: Independent  PATIENT GOALS: To reduce pain  NEXT MD VISIT: ~ Late October  OBJECTIVE:   HAND DOMINANCE: Right  ADLs: Overall ADLs: Pt reporting having to use his LUE to assist with all dressing and bathing, especially when having to reach his RUE up in flexion or abduction. Lifting causes max pain and difficulty, relying mostly on LUE.   FUNCTIONAL OUTCOME MEASURES: FOTO: 61.12/100  UPPER EXTREMITY ROM:       Assessed in seated, er/IR adducted  Active ROM Right eval  Shoulder flexion 107  Shoulder abduction 91  Shoulder internal rotation 90  Shoulder external rotation 46  (Blank rows = not tested)    UPPER EXTREMITY MMT:     Assessed in seated, er/IR adducted  MMT Right eval  Shoulder flexion 4+/5  Shoulder abduction 4+/5  Shoulder internal rotation 5/5  Shoulder external rotation 4+/5  (Blank rows = not tested)  SENSATION: Tingling in the anterior shoulder girdle  EDEMA: No swelling noted  OBSERVATIONS: moderate fascial restrictions noted in the trapezius, biceps, anterior shoulder girdle and scapular region.   TODAY'S TREATMENT:  DATE:   01/11/23 -Manual therapy: myofascial release and trigger point applied to biceps, deltoid, trapezius, and subscapularis in order to reduce pain and fascial restrictions and improve ROM.  -A/ROM: supine, flexion, abduction, protraction, horizontal abduction, er/IR, x10 -Proximal Shoulder Exercises: Paddles, criss cross, circles both directions, x10 each -ABC's in the air -Overhead lacing -scapular strengthening: green band, extension, retraction, rows, x15 -UBE: level 4, 2.5' forwards and  backwards  01/10/23 -Manual therapy: myofascial release and trigger point applied to biceps, deltoid, trapezius, and subscapularis in order to reduce pain and fascial restrictions and improve ROM.  -AA/ROM: supine, flexion, abduction, protraction, horizontal abduction, er/IR, x10 -A/ROM: supine, flexion, abduction, protraction, horizontal abduction, er/IR, x10 -Wall Slides: flexion, abduction, x10   PATIENT EDUCATION: Education details: Publishing rights manager Person educated: Patient Education method: Medical illustrator Education comprehension: verbalized understanding  HOME EXERCISE PROGRAM: 10/10: AA/ROM and A/ROM 10/11: Scapular Strengthening  GOALS: Goals reviewed with patient? Yes   SHORT TERM GOALS: Target date: 02/01/23  Pt will be provided with and educated on HEP to improve mobility in RUE required for use during ADL completion.   Goal status: IN PROGRESS  Pt will decrease pain in RUE to 3/10 or less to improve ability to sleep for 2+ consecutive hours without waking due to pain.   Goal status: IN PROGRESS  2.  Pt will decrease RUE fascial restrictions to min amounts or less to improve mobility required for functional reaching tasks.   Goal status: IN PROGRESS  3.  Pt will increase RUE A/ROM by 40 degrees to improve ability to use RUE when reaching overhead or behind back during dressing and bathing tasks.   Goal status: IN PROGRESS  4.  Pt will increase RUE strength to 5/5 or greater to improve ability to use RUE when lifting or carrying items during meal preparation/housework/yardwork tasks.   Goal status: IN PROGRESS  5.  Pt will return to highest level of function using RUE as dominant during functional task completion.   Goal status: IN PROGRESS   ASSESSMENT:  CLINICAL IMPRESSION: Pt presenting this session with good ROM. He was able to achieve full A/ROM with no discomfort and limitations this session. OT added multiple stability exercises  this session to improve endurance and stability, as well as reduce pain. Verbal and tactile cuing provided for positioning and technique.   PERFORMANCE DEFICITS: in functional skills including in functional skills including ADLs, IADLs, coordination, tone, ROM, strength, pain, fascial restrictions, muscle spasms, and UE functional use.    PLAN:  OT FREQUENCY: 2x/week  OT DURATION: 4 weeks  PLANNED INTERVENTIONS: self care/ADL training, therapeutic exercise, therapeutic activity, neuromuscular re-education, manual therapy, passive range of motion, splinting, electrical stimulation, ultrasound, moist heat, cryotherapy, patient/family education, and DME and/or AE instructions  RECOMMENDED OTHER SERVICES: N/A  CONSULTED AND AGREED WITH PLAN OF CARE: Patient  PLAN FOR NEXT SESSION: Manual Therapy, AA/ROM, A/ROM, Proximal shoulder strengthening   Trish Mage, OTR/L [facility] [phone] Shirlyn Savin Rosemarie Beath, OT 01/11/2023, 8:03 AM

## 2023-01-11 NOTE — Patient Instructions (Signed)

## 2023-01-15 ENCOUNTER — Ambulatory Visit (HOSPITAL_COMMUNITY): Payer: Medicare HMO | Admitting: Occupational Therapy

## 2023-01-15 ENCOUNTER — Encounter (HOSPITAL_COMMUNITY): Payer: Self-pay | Admitting: Occupational Therapy

## 2023-01-15 DIAGNOSIS — M25611 Stiffness of right shoulder, not elsewhere classified: Secondary | ICD-10-CM

## 2023-01-15 DIAGNOSIS — R29898 Other symptoms and signs involving the musculoskeletal system: Secondary | ICD-10-CM

## 2023-01-15 DIAGNOSIS — M25511 Pain in right shoulder: Secondary | ICD-10-CM

## 2023-01-15 NOTE — Patient Instructions (Signed)

## 2023-01-15 NOTE — Therapy (Unsigned)
OUTPATIENT OCCUPATIONAL THERAPY ORTHO TREATMENT NOTE  Patient Name: Richard Hampton MRN: [id number] DOB:04-Mar-1976, 47 y.o., male Today's Date: 01/16/2023   END OF SESSION:  OT End of Session - 01/15/23 0847     Visit Number 4    Number of Visits 8    Date for OT Re-Evaluation 02/01/23    Authorization Type [employer]    Authorization Time Period 8 visits approved (01/04/23-02/01/23)    Authorization - Visit Number 3    Authorization - Number of Visits 8    OT Start Time 0808    OT Stop Time 0847    OT Time Calculation (min) 39 min    Activity Tolerance Patient tolerated treatment well    Behavior During Therapy [facility] for tasks assessed/performed             Past Medical History:  Diagnosis Date   Collapsed lung    right side   COPD (chronic obstructive pulmonary disease) (HCC)    Hyperlipidemia    Past Surgical History:  Procedure Laterality Date   SPINE SURGERY     VASECTOMY     Patient Active Problem List   Diagnosis Date Noted   H N P-LUMBAR 12/08/2008   LOW BACK PAIN 12/08/2008   SCIATICA 12/08/2008   Mononeuritis of upper limb 06/04/2007    PCP: Kirstie Peri, MD REFERRING PROVIDER: Frederico Hamman, MD  ONSET DATE: ~1 month  REFERRING DIAG: R shoulder pain  THERAPY DIAG:  Right shoulder pain, unspecified chronicity  Shoulder stiffness, right  Other symptoms and signs involving the musculoskeletal system  Rationale for Evaluation and Treatment: Rehabilitation  SUBJECTIVE:   SUBJECTIVE STATEMENT: "Not feeling too bad today" Pt accompanied by: self  PERTINENT HISTORY: Pt reports intermittent pain over the past several years following an accident. Pt reports in the past month his pain has flared up to the worst it has been with radiating pain down his arm starting at the shoulder. Pt is seeing an orthopedic surgeon, no imaging completed at this time.   PRECAUTIONS: None  WEIGHT BEARING RESTRICTIONS: No  PAIN:  Are you having pain? Yes:  NPRS scale: 5/10 Pain location: anterior shoulder girdle Pain description: sharp and aching Aggravating factors: nothing Relieving factors: unsure  FALLS: Has patient fallen in last 6 months? No  PLOF: Independent  PATIENT GOALS: To reduce pain  NEXT MD VISIT: ~ Late October  OBJECTIVE:   HAND DOMINANCE: Right  ADLs: Overall ADLs: Pt reporting having to use his LUE to assist with all dressing and bathing, especially when having to reach his RUE up in flexion or abduction. Lifting causes max pain and difficulty, relying mostly on LUE.   FUNCTIONAL OUTCOME MEASURES: FOTO: 61.12/100  UPPER EXTREMITY ROM:       Assessed in seated, er/IR adducted  Active ROM Right eval  Shoulder flexion 107  Shoulder abduction 91  Shoulder internal rotation 90  Shoulder external rotation 46  (Blank rows = not tested)    UPPER EXTREMITY MMT:     Assessed in seated, er/IR adducted  MMT Right eval  Shoulder flexion 4+/5  Shoulder abduction 4+/5  Shoulder internal rotation 5/5  Shoulder external rotation 4+/5  (Blank rows = not tested)  SENSATION: Tingling in the anterior shoulder girdle  EDEMA: No swelling noted  OBSERVATIONS: moderate fascial restrictions noted in the trapezius, biceps, anterior shoulder girdle and scapular region.   TODAY'S TREATMENT:  DATE:   01/15/23 -Manual therapy: myofascial release and trigger point applied to biceps, deltoid, trapezius, and subscapularis in order to reduce pain and fascial restrictions and improve ROM.  -A/ROM: seated, flexion, abduction, protraction, horizontal abduction, er/IR, x10 -PNF strengthening: blue theraband, chest pulls, overhead pulls, er pulls, PNF up, PNF down, x10 -Loop Band Exercises: blue band, wall taps, v ups, wall clocks, x10  01/11/23 -Manual therapy: myofascial release and trigger point  applied to biceps, deltoid, trapezius, and subscapularis in order to reduce pain and fascial restrictions and improve ROM.  -A/ROM: supine, flexion, abduction, protraction, horizontal abduction, er/IR, x10 -Proximal Shoulder Exercises: Paddles, criss cross, circles both directions, x10 each -ABC's in the air -Overhead lacing -scapular strengthening: green band, extension, retraction, rows, x15 -UBE: level 4, 2.5' forwards and backwards  01/10/23 -Manual therapy: myofascial release and trigger point applied to biceps, deltoid, trapezius, and subscapularis in order to reduce pain and fascial restrictions and improve ROM.  -AA/ROM: supine, flexion, abduction, protraction, horizontal abduction, er/IR, x10 -A/ROM: supine, flexion, abduction, protraction, horizontal abduction, er/IR, x10 -Wall Slides: flexion, abduction, x10   PATIENT EDUCATION: Education details: PNF Strengthening Person educated: Patient Education method: Medical illustrator Education comprehension: verbalized understanding  HOME EXERCISE PROGRAM: 10/10: AA/ROM and A/ROM 10/11: Scapular Strengthening 10/14: PNF Strengthening  GOALS: Goals reviewed with patient? Yes   SHORT TERM GOALS: Target date: 02/01/23  Pt will be provided with and educated on HEP to improve mobility in RUE required for use during ADL completion.   Goal status: IN PROGRESS  Pt will decrease pain in RUE to 3/10 or less to improve ability to sleep for 2+ consecutive hours without waking due to pain.   Goal status: IN PROGRESS  2.  Pt will decrease RUE fascial restrictions to min amounts or less to improve mobility required for functional reaching tasks.   Goal status: IN PROGRESS  3.  Pt will increase RUE A/ROM by 40 degrees to improve ability to use RUE when reaching overhead or behind back during dressing and bathing tasks.   Goal status: IN PROGRESS  4.  Pt will increase RUE strength to 5/5 or greater to improve ability to  use RUE when lifting or carrying items during meal preparation/housework/yardwork tasks.   Goal status: IN PROGRESS  5.  Pt will return to highest level of function using RUE as dominant during functional task completion.   Goal status: IN PROGRESS   ASSESSMENT:  CLINICAL IMPRESSION: This session focused on strengthening and stability exercises. He continues to demonstrate good ROM with minimal effort both in supine and sitting. OT initiated PNF strengthening and Loop band exercises to address scapular strengthening and overall stability of the shoulder girdle. Pt reported increased fatigue, but was able to complete tasks with minimal breaks. Verbal and tactile cuing provided for positioning and technique.   PERFORMANCE DEFICITS: in functional skills including in functional skills including ADLs, IADLs, coordination, tone, ROM, strength, pain, fascial restrictions, muscle spasms, and UE functional use.    PLAN:  OT FREQUENCY: 2x/week  OT DURATION: 4 weeks  PLANNED INTERVENTIONS: self care/ADL training, therapeutic exercise, therapeutic activity, neuromuscular re-education, manual therapy, passive range of motion, splinting, electrical stimulation, ultrasound, moist heat, cryotherapy, patient/family education, and DME and/or AE instructions  RECOMMENDED OTHER SERVICES: N/A  CONSULTED AND AGREED WITH PLAN OF CARE: Patient  PLAN FOR NEXT SESSION: Manual Therapy, AA/ROM, A/ROM, Proximal shoulder strengthening   Trish Mage, OTR/L [facility] [phone] [place] Bojanowski Rosemarie Beath, OT 01/16/2023, 2:28 PM

## 2023-01-18 ENCOUNTER — Ambulatory Visit (HOSPITAL_COMMUNITY): Payer: Medicare HMO | Admitting: Occupational Therapy

## 2023-01-18 ENCOUNTER — Encounter (HOSPITAL_COMMUNITY): Payer: Self-pay | Admitting: Occupational Therapy

## 2023-01-18 DIAGNOSIS — R29898 Other symptoms and signs involving the musculoskeletal system: Secondary | ICD-10-CM | POA: Diagnosis not present

## 2023-01-18 DIAGNOSIS — M25511 Pain in right shoulder: Secondary | ICD-10-CM

## 2023-01-18 DIAGNOSIS — M25611 Stiffness of right shoulder, not elsewhere classified: Secondary | ICD-10-CM

## 2023-01-18 NOTE — Therapy (Signed)
OUTPATIENT OCCUPATIONAL THERAPY ORTHO TREATMENT NOTE  Patient Name: Richard Hampton MRN: [id number] DOB:08-15-1975, 47 y.o., male Today's Date: 01/18/2023   END OF SESSION:  OT End of Session - 01/18/23 0853     Visit Number 5    Number of Visits 8    Date for OT Re-Evaluation 02/01/23    Authorization Type [employer]    Authorization Time Period 8 visits approved (01/04/23-02/01/23)    Authorization - Visit Number 4    Authorization - Number of Visits 8    OT Start Time 0800    OT Stop Time 0845    OT Time Calculation (min) 45 min    Activity Tolerance Patient tolerated treatment well    Behavior During Therapy WFL for tasks assessed/performed              Past Medical History:  Diagnosis Date   Collapsed lung    right side   COPD (chronic obstructive pulmonary disease) (HCC)    Hyperlipidemia    Past Surgical History:  Procedure Laterality Date   SPINE SURGERY     VASECTOMY     Patient Active Problem List   Diagnosis Date Noted   H N P-LUMBAR 12/08/2008   LOW BACK PAIN 12/08/2008   SCIATICA 12/08/2008   Mononeuritis of upper limb 06/04/2007    PCP: Kirstie Peri, MD REFERRING PROVIDER: Frederico Hamman, MD  ONSET DATE: ~1 month  REFERRING DIAG: R shoulder pain  THERAPY DIAG:  Right shoulder pain, unspecified chronicity  Shoulder stiffness, right  Rationale for Evaluation and Treatment: Rehabilitation  SUBJECTIVE:   SUBJECTIVE STATEMENT: "I have been doing my exercises basically with the weed eater" Pt accompanied by: self  PERTINENT HISTORY: Pt reports intermittent pain over the past several years following an accident. Pt reports in the past month his pain has flared up to the worst it has been with radiating pain down his arm starting at the shoulder. Pt is seeing an orthopedic surgeon, no imaging completed at this time.   PRECAUTIONS: None  WEIGHT BEARING RESTRICTIONS: No  PAIN:  Are you having pain? Yes: NPRS scale: 7/10 Pain  location: anterior shoulder girdle Pain description: sharp and aching Aggravating factors: nothing Relieving factors: unsure  FALLS: Has patient fallen in last 6 months? No  PLOF: Independent  PATIENT GOALS: To reduce pain  NEXT MD VISIT: ~ Late October  OBJECTIVE:   HAND DOMINANCE: Right  ADLs: Overall ADLs: Pt reporting having to use his LUE to assist with all dressing and bathing, especially when having to reach his RUE up in flexion or abduction. Lifting causes max pain and difficulty, relying mostly on LUE.   FUNCTIONAL OUTCOME MEASURES: FOTO: 61.12/100  UPPER EXTREMITY ROM:       Assessed in seated, er/IR adducted  Active ROM Right eval  Shoulder flexion 107  Shoulder abduction 91  Shoulder internal rotation 90  Shoulder external rotation 46  (Blank rows = not tested)    UPPER EXTREMITY MMT:     Assessed in seated, er/IR adducted  MMT Right eval  Shoulder flexion 4+/5  Shoulder abduction 4+/5  Shoulder internal rotation 5/5  Shoulder external rotation 4+/5  (Blank rows = not tested)  SENSATION: Tingling in the anterior shoulder girdle  EDEMA: No swelling noted  OBSERVATIONS: moderate fascial restrictions noted in the trapezius, biceps, anterior shoulder girdle and scapular region.   TODAY'S TREATMENT:  DATE:   01/18/23  -Manual therapy: myofascial release and trigger point applied to biceps, deltoid, trapezius, and subscapularis in order to reduce pain and fascial restrictions and improve ROM.  -A/ROM: seated, flexion, abduction, protraction, horizontal abduction, er/IR, x10  - Scapular strength: row, extension, retraction 10x 3 second pause  at end of exercise  -PNF strengthening: blue theraband, chest pulls, overhead pulls, er pulls, PNF up, PNF down, x10 - Shoulder strength: shoulder press, flexion, abduction, horizontal  abduction with 2lb dumb bells   01/15/23 -Manual therapy: myofascial release and trigger point applied to biceps, deltoid, trapezius, and subscapularis in order to reduce pain and fascial restrictions and improve ROM.  -A/ROM: seated, flexion, abduction, protraction, horizontal abduction, er/IR, x10 -PNF strengthening: blue theraband, chest pulls, overhead pulls, er pulls, PNF up, PNF down, x10 -Loop Band Exercises: blue band, wall taps, v ups, wall clocks, x10  01/11/23 -Manual therapy: myofascial release and trigger point applied to biceps, deltoid, trapezius, and subscapularis in order to reduce pain and fascial restrictions and improve ROM.  -A/ROM: supine, flexion, abduction, protraction, horizontal abduction, er/IR, x10 -Proximal Shoulder Exercises: Paddles, criss cross, circles both directions, x10 each -ABC's in the air -Overhead lacing -scapular strengthening: green band, extension, retraction, rows, x15 -UBE: level 4, 2.5' forwards and backwards   PATIENT EDUCATION: Education details: PNF Strengthening Person educated: Patient Education method: Medical illustrator Education comprehension: verbalized understanding  HOME EXERCISE PROGRAM: 10/10: AA/ROM and A/ROM 10/11: Scapular Strengthening 10/14: PNF Strengthening  GOALS: Goals reviewed with patient? Yes   SHORT TERM GOALS: Target date: 02/01/23  Pt will be provided with and educated on HEP to improve mobility in RUE required for use during ADL completion.   Goal status: IN PROGRESS  Pt will decrease pain in RUE to 3/10 or less to improve ability to sleep for 2+ consecutive hours without waking due to pain.   Goal status: IN PROGRESS  2.  Pt will decrease RUE fascial restrictions to min amounts or less to improve mobility required for functional reaching tasks.   Goal status: IN PROGRESS  3.  Pt will increase RUE A/ROM by 40 degrees to improve ability to use RUE when reaching overhead or behind back  during dressing and bathing tasks.   Goal status: IN PROGRESS  4.  Pt will increase RUE strength to 5/5 or greater to improve ability to use RUE when lifting or carrying items during meal preparation/housework/yardwork tasks.   Goal status: IN PROGRESS  5.  Pt will return to highest level of function using RUE as dominant during functional task completion.   Goal status: IN PROGRESS   ASSESSMENT:  CLINICAL IMPRESSION: Pt continues to demonstrate good ROM seated and standing. Noted moderate fascial restrictions in pectoralis region. Focused on shoulder and scapular strength again this session. Used blue theraband for all banded exercises in standing. Pt reports that he continues to have pain despite having full ROM, discussed bringing up imaging/MRI at his next MD appt to explore potential injuries/tears.   PERFORMANCE DEFICITS: in functional skills including in functional skills including ADLs, IADLs, coordination, tone, ROM, strength, pain, fascial restrictions, muscle spasms, and UE functional use.    PLAN:  OT FREQUENCY: 2x/week  OT DURATION: 4 weeks  PLANNED INTERVENTIONS: self care/ADL training, therapeutic exercise, therapeutic activity, neuromuscular re-education, manual therapy, passive range of motion, splinting, electrical stimulation, ultrasound, moist heat, cryotherapy, patient/family education, and DME and/or AE instructions  RECOMMENDED OTHER SERVICES: N/A  CONSULTED AND AGREED WITH PLAN OF CARE: Patient  PLAN FOR NEXT SESSION: Manual Therapy, AA/ROM, A/ROM, Proximal shoulder strengthening    [facility] [phone] Bevelyn Ngo, OT 01/18/2023, 8:54 AM

## 2023-01-22 ENCOUNTER — Encounter (HOSPITAL_COMMUNITY): Payer: Self-pay | Admitting: Occupational Therapy

## 2023-01-22 ENCOUNTER — Ambulatory Visit (HOSPITAL_COMMUNITY): Payer: Medicare HMO | Admitting: Occupational Therapy

## 2023-01-22 DIAGNOSIS — R29898 Other symptoms and signs involving the musculoskeletal system: Secondary | ICD-10-CM

## 2023-01-22 DIAGNOSIS — M25611 Stiffness of right shoulder, not elsewhere classified: Secondary | ICD-10-CM | POA: Diagnosis not present

## 2023-01-22 DIAGNOSIS — M25511 Pain in right shoulder: Secondary | ICD-10-CM | POA: Diagnosis not present

## 2023-01-22 NOTE — Patient Instructions (Signed)
Complete _______ repetitions each. Complete _______ time a day.  Wall taps with theraband  With a looped elastic band around forearms/wrists and arms at a 90 angle pressed against the wall. Keeping elbows on the wall, tap right arm out to the right. Hold for 1 second. Return right arm back to neutral. Repeat with left arm.    Wall V slides with Theraband  Place a band loop around hands/forearms and face a wall. Extend both arms diagonally into a V shape on the wall. Hold this stretch for specified amount of time. Lower arms slowly back into neutral position. Repeat.       scap clocks  Tie a loop with a theraband and place around your wrists.  Stand with a wall in front of you.  Picture a clock in front of you.  Place both palms on the wall, arms straight.  While keeping the left/right hand planted, use the right/left hand to pull away and tap each number (1, 3, 5- right OR 11,9,7 left), coming back to center each time.

## 2023-01-22 NOTE — Therapy (Signed)
OUTPATIENT OCCUPATIONAL THERAPY ORTHO TREATMENT NOTE  Patient Name: Richard Hampton MRN: [id number] DOB:[birth year], 47 y.o., male Today's Date: 01/22/2023   END OF SESSION:  OT End of Session - 01/22/23 1003     Visit Number 6    Number of Visits 8    Date for OT Re-Evaluation 02/01/23    Authorization Type [employer]    Authorization Time Period 8 visits approved (01/04/23-02/01/23)    Authorization - Visit Number 5    Authorization - Number of Visits 8    OT Start Time [phone]    OT Stop Time 0934    OT Time Calculation (min) 43 min    Activity Tolerance Patient tolerated treatment well    Behavior During Therapy WFL for tasks assessed/performed               Past Medical History:  Diagnosis Date   Collapsed lung    right side   COPD (chronic obstructive pulmonary disease) (HCC)    Hyperlipidemia    Past Surgical History:  Procedure Laterality Date   SPINE SURGERY     VASECTOMY     Patient Active Problem List   Diagnosis Date Noted   H N P-LUMBAR 12/08/2008   LOW BACK PAIN 12/08/2008   SCIATICA 12/08/2008   Mononeuritis of upper limb 06/04/2007    PCP: Kirstie Peri, MD REFERRING PROVIDER: Frederico Hamman, MD  ONSET DATE: ~1 month  REFERRING DIAG: R shoulder pain  THERAPY DIAG:  Right shoulder pain, unspecified chronicity  Shoulder stiffness, right  Other symptoms and signs involving the musculoskeletal system  Rationale for Evaluation and Treatment: Rehabilitation  SUBJECTIVE:   SUBJECTIVE STATEMENT: "I have good days with no pain and worse days with 5-6/10 pain" Pt accompanied by: self  PERTINENT HISTORY: Pt reports intermittent pain over the past several years following an accident. Pt reports in the past month his pain has flared up to the worst it has been with radiating pain down his arm starting at the shoulder. Pt is seeing an orthopedic surgeon, no imaging completed at this time.   PRECAUTIONS: None  WEIGHT BEARING RESTRICTIONS:  No  PAIN:  Are you having pain? Yes: NPRS scale: 3/10 Pain location: anterior shoulder girdle Pain description: sharp and aching Aggravating factors: nothing Relieving factors: unsure  FALLS: Has patient fallen in last 6 months? No  PLOF: Independent  PATIENT GOALS: To reduce pain  NEXT MD VISIT: ~ Late October  OBJECTIVE:   HAND DOMINANCE: Right  ADLs: Overall ADLs: Pt reporting having to use his LUE to assist with all dressing and bathing, especially when having to reach his RUE up in flexion or abduction. Lifting causes max pain and difficulty, relying mostly on LUE.   FUNCTIONAL OUTCOME MEASURES: FOTO: 61.12/100  UPPER EXTREMITY ROM:       Assessed in seated, er/IR adducted  Active ROM Right eval  Shoulder flexion 107  Shoulder abduction 91  Shoulder internal rotation 90  Shoulder external rotation 46  (Blank rows = not tested)    UPPER EXTREMITY MMT:     Assessed in seated, er/IR adducted  MMT Right eval  Shoulder flexion 4+/5  Shoulder abduction 4+/5  Shoulder internal rotation 5/5  Shoulder external rotation 4+/5  (Blank rows = not tested)  SENSATION: Tingling in the anterior shoulder girdle  EDEMA: No swelling noted  OBSERVATIONS: moderate fascial restrictions noted in the trapezius, biceps, anterior shoulder girdle and scapular region.   TODAY'S TREATMENT:  DATE:   01/22/23  -Manual therapy: myofascial release and trigger point applied to biceps, deltoid, trapezius, and subscapularis in order to reduce pain and fascial restrictions and improve ROM.  -A/ROM: seated, flexion, abduction, protraction, horizontal abduction, er/IR, x12 -PNF strengthening: blue theraband, chest pulls, overhead pulls, er pulls, PNF up, PNF down, x15 -Loop Band Exercises: blue band, wall taps, v ups, wall clocks, x10  01/18/23  -Manual  therapy: myofascial release and trigger point applied to biceps, deltoid, trapezius, and subscapularis in order to reduce pain and fascial restrictions and improve ROM.  -A/ROM: seated, flexion, abduction, protraction, horizontal abduction, er/IR, x10  - Scapular strength: row, extension, retraction 10x 3 second pause  at end of exercise  -PNF strengthening: blue theraband, chest pulls, overhead pulls, er pulls, PNF up, PNF down, x10 - Shoulder strength: shoulder press, flexion, abduction, horizontal abduction with 2lb dumb bells   01/15/23 -Manual therapy: myofascial release and trigger point applied to biceps, deltoid, trapezius, and subscapularis in order to reduce pain and fascial restrictions and improve ROM.  -A/ROM: seated, flexion, abduction, protraction, horizontal abduction, er/IR, x10 -PNF strengthening: blue theraband, chest pulls, overhead pulls, er pulls, PNF up, PNF down, x10 -Loop Band Exercises: blue band, wall taps, v ups, wall clocks, x10   PATIENT EDUCATION: Education details: Loop Band Exercises Person educated: Patient Education method: Medical illustrator Education comprehension: verbalized understanding  HOME EXERCISE PROGRAM: 10/10: AA/ROM and A/ROM 10/11: Scapular Strengthening 10/14: PNF Strengthening 10/22: Loop Band Exercises  GOALS: Goals reviewed with patient? Yes   SHORT TERM GOALS: Target date: 02/01/23  Pt will be provided with and educated on HEP to improve mobility in RUE required for use during ADL completion.   Goal status: IN PROGRESS  Pt will decrease pain in RUE to 3/10 or less to improve ability to sleep for 2+ consecutive hours without waking due to pain.   Goal status: IN PROGRESS  2.  Pt will decrease RUE fascial restrictions to min amounts or less to improve mobility required for functional reaching tasks.   Goal status: IN PROGRESS  3.  Pt will increase RUE A/ROM by 40 degrees to improve ability to use RUE when  reaching overhead or behind back during dressing and bathing tasks.   Goal status: IN PROGRESS  4.  Pt will increase RUE strength to 5/5 or greater to improve ability to use RUE when lifting or carrying items during meal preparation/housework/yardwork tasks.   Goal status: IN PROGRESS  5.  Pt will return to highest level of function using RUE as dominant during functional task completion.   Goal status: IN PROGRESS   ASSESSMENT:  CLINICAL IMPRESSION: This session pt continuing to demonstrate good ROM and improving strength. He continues to tolerate blue theraband well with minimum rest breaks required. Pt does report that on average he continues to have 5-6/10 pain despite his functional improvements. 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 Verbal and tactile cuing provided throughout session for positioning and technique.   PERFORMANCE DEFICITS: in functional skills including in functional skills including ADLs, IADLs, coordination, tone, ROM, strength, pain, fascial restrictions, muscle spasms, and UE functional use.    PLAN:  OT FREQUENCY: 2x/week  OT DURATION: 4 weeks  PLANNED INTERVENTIONS: self care/ADL training, therapeutic exercise, therapeutic activity, neuromuscular re-education, manual therapy, passive range of motion, splinting, electrical stimulation, ultrasound,  moist heat, cryotherapy, patient/family education, and DME and/or AE instructions  RECOMMENDED OTHER SERVICES: N/A  CONSULTED AND AGREED WITH PLAN OF CARE: Patient  PLAN FOR NEXT SESSION: Manual Therapy, AA/ROM, A/ROM, Proximal shoulder strengthening   Trish Mage, OTR/L [facility] [phone] Kennyth Arnold, OT 01/22/2023, 10:04 AM

## 2023-01-25 ENCOUNTER — Encounter (HOSPITAL_COMMUNITY): Payer: Self-pay | Admitting: Occupational Therapy

## 2023-01-25 ENCOUNTER — Ambulatory Visit (HOSPITAL_COMMUNITY): Payer: Medicare HMO | Admitting: Occupational Therapy

## 2023-01-25 DIAGNOSIS — M25511 Pain in right shoulder: Secondary | ICD-10-CM

## 2023-01-25 DIAGNOSIS — R29898 Other symptoms and signs involving the musculoskeletal system: Secondary | ICD-10-CM

## 2023-01-25 DIAGNOSIS — M25611 Stiffness of right shoulder, not elsewhere classified: Secondary | ICD-10-CM | POA: Diagnosis not present

## 2023-01-25 NOTE — Therapy (Signed)
OUTPATIENT OCCUPATIONAL THERAPY ORTHO TREATMENT NOTE  Patient Name: Richard Hampton MRN: [id number] DOB:30-Aug-1975, 47 y.o., male Today's Date: 01/27/2023   END OF SESSION:    01/25/23 0848  OT Visits / Re-Eval  Visit Number 7  Number of Visits 8  Date for OT Re-Evaluation 02/01/23  Authorization  Authorization Type [employer]  Authorization Time Period 8 visits approved (01/04/23-02/01/23)  Authorization - Visit Number 6  Authorization - Number of Visits 8  OT Time Calculation  OT Start Time 0809  OT Stop Time 0848  OT Time Calculation (min) 39 min  End of Session  Activity Tolerance Patient tolerated treatment well  Behavior During Therapy WFL for tasks assessed/performed      Past Medical History:  Diagnosis Date   Collapsed lung    right side   COPD (chronic obstructive pulmonary disease) (HCC)    Hyperlipidemia    Past Surgical History:  Procedure Laterality Date   SPINE SURGERY     VASECTOMY     Patient Active Problem List   Diagnosis Date Noted   H N P-LUMBAR 12/08/2008   LOW BACK PAIN 12/08/2008   SCIATICA 12/08/2008   Mononeuritis of upper limb 06/04/2007    PCP: Kirstie Peri, MD REFERRING PROVIDER: Frederico Hamman, MD  ONSET DATE: ~1 month  REFERRING DIAG: R shoulder pain  THERAPY DIAG:  Right shoulder pain, unspecified chronicity  Shoulder stiffness, right  Other symptoms and signs involving the musculoskeletal system  Rationale for Evaluation and Treatment: Rehabilitation  SUBJECTIVE:   SUBJECTIVE STATEMENT: "I have good days with no pain and worse days with 5-6/10 pain" Pt accompanied by: self  PERTINENT HISTORY: Pt reports intermittent pain over the past several years following an accident. Pt reports in the past month his pain has flared up to the worst it has been with radiating pain down his arm starting at the shoulder. Pt is seeing an orthopedic surgeon, no imaging completed at this time.   PRECAUTIONS: None  WEIGHT  BEARING RESTRICTIONS: No  PAIN:  Are you having pain? Yes: NPRS scale: 3/10 Pain location: anterior shoulder girdle Pain description: sharp and aching Aggravating factors: nothing Relieving factors: unsure  FALLS: Has patient fallen in last 6 months? No  PLOF: Independent  PATIENT GOALS: To reduce pain  NEXT MD VISIT: ~ Late October  OBJECTIVE:   HAND DOMINANCE: Right  ADLs: Overall ADLs: Pt reporting having to use his LUE to assist with all dressing and bathing, especially when having to reach his RUE up in flexion or abduction. Lifting causes max pain and difficulty, relying mostly on LUE.   FUNCTIONAL OUTCOME MEASURES: FOTO: 61.12/100  UPPER EXTREMITY ROM:       Assessed in seated, er/IR adducted  Active ROM Right eval  Shoulder flexion 107  Shoulder abduction 91  Shoulder internal rotation 90  Shoulder external rotation 46  (Blank rows = not tested)    UPPER EXTREMITY MMT:     Assessed in seated, er/IR adducted  MMT Right eval  Shoulder flexion 4+/5  Shoulder abduction 4+/5  Shoulder internal rotation 5/5  Shoulder external rotation 4+/5  (Blank rows = not tested)  SENSATION: Tingling in the anterior shoulder girdle  EDEMA: No swelling noted  OBSERVATIONS: moderate fascial restrictions noted in the trapezius, biceps, anterior shoulder girdle and scapular region.   TODAY'S TREATMENT:  DATE:   01/25/23  -Manual therapy: myofascial release and trigger point applied to biceps, deltoid, trapezius, and subscapularis in order to reduce pain and fascial restrictions and improve ROM.  -A/ROM: seated, flexion, abduction, protraction, horizontal abduction, er/IR, x15 -Shoulder Strengthening: green band, flexion, abduction, blue band, er, IR, horizontal abduction, x15 -Scapular Strengthening: blue band, extension, retraction, rows,  x15  01/22/23  -Manual therapy: myofascial release and trigger point applied to biceps, deltoid, trapezius, and subscapularis in order to reduce pain and fascial restrictions and improve ROM.  -A/ROM: seated, flexion, abduction, protraction, horizontal abduction, er/IR, x12 -PNF strengthening: blue theraband, chest pulls, overhead pulls, er pulls, PNF up, PNF down, x15 -Loop Band Exercises: blue band, wall taps, v ups, wall clocks, x10  01/18/23  -Manual therapy: myofascial release and trigger point applied to biceps, deltoid, trapezius, and subscapularis in order to reduce pain and fascial restrictions and improve ROM.  -A/ROM: seated, flexion, abduction, protraction, horizontal abduction, er/IR, x10  - Scapular strength: row, extension, retraction 10x 3 second pause  at end of exercise  -PNF strengthening: blue theraband, chest pulls, overhead pulls, er pulls, PNF up, PNF down, x10 - Shoulder strength: shoulder press, flexion, abduction, horizontal abduction with 2lb dumb bells    PATIENT EDUCATION: Education details: Public relations account executive  Person educated: Patient Education method: Explanation and Demonstration Education comprehension: verbalized understanding  HOME EXERCISE PROGRAM: 10/10: AA/ROM and A/ROM 10/11: Scapular Strengthening 10/14: PNF Strengthening 10/22: Loop Band Exercises 10/25: Shoulder strengthening  GOALS: Goals reviewed with patient? Yes   SHORT TERM GOALS: Target date: 02/01/23  Pt will be provided with and educated on HEP to improve mobility in RUE required for use during ADL completion.   Goal status: IN PROGRESS  Pt will decrease pain in RUE to 3/10 or less to improve ability to sleep for 2+ consecutive hours without waking due to pain.   Goal status: IN PROGRESS  2.  Pt will decrease RUE fascial restrictions to min amounts or less to improve mobility required for functional reaching tasks.   Goal status: IN PROGRESS  3.  Pt will increase RUE  A/ROM by 40 degrees to improve ability to use RUE when reaching overhead or behind back during dressing and bathing tasks.   Goal status: IN PROGRESS  4.  Pt will increase RUE strength to 5/5 or greater to improve ability to use RUE when lifting or carrying items during meal preparation/housework/yardwork tasks.   Goal status: IN PROGRESS  5.  Pt will return to highest level of function using RUE as dominant during functional task completion.   Goal status: IN PROGRESS   ASSESSMENT:  CLINICAL IMPRESSION: Pt's mobility is [facility] this session, with no noted limitations against gravity. He continues to work on shoulder strengthening and scapular strengthening, improving to the blue band. Pt did not report any pain this session with exercises, however fatigue was noted as he required 3 short rest breaks during resistance band exercises. Verbal and tactile cuing provided for positioning and technique throughout session.   PERFORMANCE DEFICITS: in functional skills including in functional skills including ADLs, IADLs, coordination, tone, ROM, strength, pain, fascial restrictions, muscle spasms, and UE functional use.    PLAN:  OT FREQUENCY: 2x/week  OT DURATION: 4 weeks  PLANNED INTERVENTIONS: self care/ADL training, therapeutic exercise, therapeutic activity, neuromuscular re-education, manual therapy, passive range of motion, splinting, electrical stimulation, ultrasound, moist heat, cryotherapy, patient/family education, and DME and/or AE instructions  RECOMMENDED OTHER SERVICES: N/A  CONSULTED AND AGREED WITH PLAN OF  CARE: Patient  PLAN FOR NEXT SESSION: Manual Therapy, AA/ROM, A/ROM, Proximal shoulder strengthening   Trish Mage, OTR/L [facility] [phone] Kennyth Arnold, OT 01/27/2023, 1:38 PM

## 2023-01-29 ENCOUNTER — Encounter (HOSPITAL_COMMUNITY): Payer: Self-pay | Admitting: Occupational Therapy

## 2023-01-29 ENCOUNTER — Ambulatory Visit (HOSPITAL_COMMUNITY): Payer: Medicare HMO | Admitting: Occupational Therapy

## 2023-01-29 DIAGNOSIS — R29898 Other symptoms and signs involving the musculoskeletal system: Secondary | ICD-10-CM | POA: Diagnosis not present

## 2023-01-29 DIAGNOSIS — M25511 Pain in right shoulder: Secondary | ICD-10-CM

## 2023-01-29 DIAGNOSIS — M25611 Stiffness of right shoulder, not elsewhere classified: Secondary | ICD-10-CM

## 2023-01-29 NOTE — Therapy (Unsigned)
OUTPATIENT OCCUPATIONAL THERAPY ORTHO TREATMENT NOTE  Patient Name: Richard Hampton MRN: [id number] DOB:12-Jan-1976, 47 y.o., male Today's Date: 01/29/2023   END OF SESSION:    01/25/23 0848  OT Visits / Re-Eval  Visit Number 7  Number of Visits 8  Date for OT Re-Evaluation 02/01/23  Authorization  Authorization Type [employer]  Authorization Time Period 8 visits approved (01/04/23-02/01/23)  Authorization - Visit Number 6  Authorization - Number of Visits 8  OT Time Calculation  OT Start Time 0809  OT Stop Time 0848  OT Time Calculation (min) 39 min  End of Session  Activity Tolerance Patient tolerated treatment well  Behavior During Therapy WFL for tasks assessed/performed      Past Medical History:  Diagnosis Date   Collapsed lung    right side   COPD (chronic obstructive pulmonary disease) (HCC)    Hyperlipidemia    Past Surgical History:  Procedure Laterality Date   SPINE SURGERY     VASECTOMY     Patient Active Problem List   Diagnosis Date Noted   H N P-LUMBAR 12/08/2008   LOW BACK PAIN 12/08/2008   SCIATICA 12/08/2008   Mononeuritis of upper limb 06/04/2007    PCP: Kirstie Peri, MD REFERRING PROVIDER: Frederico Hamman, MD  ONSET DATE: ~1 month  REFERRING DIAG: R shoulder pain  THERAPY DIAG:  No diagnosis found.  Rationale for Evaluation and Treatment: Rehabilitation  SUBJECTIVE:   SUBJECTIVE STATEMENT: "I have good days with no pain and worse days with 5-6/10 pain" Pt accompanied by: self  PERTINENT HISTORY: Pt reports intermittent pain over the past several years following an accident. Pt reports in the past month his pain has flared up to the worst it has been with radiating pain down his arm starting at the shoulder. Pt is seeing an orthopedic surgeon, no imaging completed at this time.   PRECAUTIONS: None  WEIGHT BEARING RESTRICTIONS: No  PAIN:  Are you having pain? Yes: NPRS scale: 3/10 Pain location: anterior shoulder  girdle Pain description: sharp and aching Aggravating factors: nothing Relieving factors: unsure  FALLS: Has patient fallen in last 6 months? No  PLOF: Independent  PATIENT GOALS: To reduce pain  NEXT MD VISIT: ~ Late October  OBJECTIVE:   HAND DOMINANCE: Right  ADLs: Overall ADLs: Pt reporting having to use his LUE to assist with all dressing and bathing, especially when having to reach his RUE up in flexion or abduction. Lifting causes max pain and difficulty, relying mostly on LUE.   FUNCTIONAL OUTCOME MEASURES: FOTO: 61.12/100 01/29/23: 100/100  UPPER EXTREMITY ROM:       Assessed in seated, er/IR adducted  Active ROM Right eval Right 10/29  Shoulder flexion 107 168  Shoulder abduction 91 175  Shoulder internal rotation 90 90  Shoulder external rotation 46 74  (Blank rows = not tested)    UPPER EXTREMITY MMT:     Assessed in seated, er/IR adducted  MMT Right eval Right 10/29  Shoulder flexion 4+/5 5/5  Shoulder abduction 4+/5 5/5  Shoulder internal rotation 5/5 5/5  Shoulder external rotation 4+/5 5/5  (Blank rows = not tested)  SENSATION: Tingling in the anterior shoulder girdle  EDEMA: No swelling noted  OBSERVATIONS: moderate fascial restrictions noted in the trapezius, biceps, anterior shoulder girdle and scapular region.   TODAY'S TREATMENT:  DATE:   01/25/23  -Manual therapy: myofascial release and trigger point applied to biceps, deltoid, trapezius, and subscapularis in order to reduce pain and fascial restrictions and improve ROM.  -A/ROM: seated, flexion, abduction, protraction, horizontal abduction, er/IR, x15 -Shoulder Strengthening: green band, flexion, abduction, blue band, er, IR, horizontal abduction, x15 -Scapular Strengthening: blue band, extension, retraction, rows, x15  01/22/23  -Manual therapy:  myofascial release and trigger point applied to biceps, deltoid, trapezius, and subscapularis in order to reduce pain and fascial restrictions and improve ROM.  -A/ROM: seated, flexion, abduction, protraction, horizontal abduction, er/IR, x12 -PNF strengthening: blue theraband, chest pulls, overhead pulls, er pulls, PNF up, PNF down, x15 -Loop Band Exercises: blue band, wall taps, v ups, wall clocks, x10  01/18/23  -Manual therapy: myofascial release and trigger point applied to biceps, deltoid, trapezius, and subscapularis in order to reduce pain and fascial restrictions and improve ROM.  -A/ROM: seated, flexion, abduction, protraction, horizontal abduction, er/IR, x10  - Scapular strength: row, extension, retraction 10x 3 second pause  at end of exercise  -PNF strengthening: blue theraband, chest pulls, overhead pulls, er pulls, PNF up, PNF down, x10 - Shoulder strength: shoulder press, flexion, abduction, horizontal abduction with 2lb dumb bells    PATIENT EDUCATION: Education details: Public relations account executive  Person educated: Patient Education method: Explanation and Demonstration Education comprehension: verbalized understanding  HOME EXERCISE PROGRAM: 10/10: AA/ROM and A/ROM 10/11: Scapular Strengthening 10/14: PNF Strengthening 10/22: Loop Band Exercises 10/25: Shoulder strengthening  GOALS: Goals reviewed with patient? Yes   SHORT TERM GOALS: Target date: 02/01/23  Pt will be provided with and educated on HEP to improve mobility in RUE required for use during ADL completion.   Goal status: IN PROGRESS  Pt will decrease pain in RUE to 3/10 or less to improve ability to sleep for 2+ consecutive hours without waking due to pain.   Goal status: IN PROGRESS  2.  Pt will decrease RUE fascial restrictions to min amounts or less to improve mobility required for functional reaching tasks.   Goal status: IN PROGRESS  3.  Pt will increase RUE A/ROM by 40 degrees to improve  ability to use RUE when reaching overhead or behind back during dressing and bathing tasks.   Goal status: IN PROGRESS  4.  Pt will increase RUE strength to 5/5 or greater to improve ability to use RUE when lifting or carrying items during meal preparation/housework/yardwork tasks.   Goal status: IN PROGRESS  5.  Pt will return to highest level of function using RUE as dominant during functional task completion.   Goal status: IN PROGRESS   ASSESSMENT:  CLINICAL IMPRESSION: Pt's mobility is [facility] this session, with no noted limitations against gravity. He continues to work on shoulder strengthening and scapular strengthening, improving to the blue band. Pt did not report any pain this session with exercises, however fatigue was noted as he required 3 short rest breaks during resistance band exercises. Verbal and tactile cuing provided for positioning and technique throughout session.   PERFORMANCE DEFICITS: in functional skills including in functional skills including ADLs, IADLs, coordination, tone, ROM, strength, pain, fascial restrictions, muscle spasms, and UE functional use.    PLAN:  OT FREQUENCY: 2x/week  OT DURATION: 4 weeks  PLANNED INTERVENTIONS: self care/ADL training, therapeutic exercise, therapeutic activity, neuromuscular re-education, manual therapy, passive range of motion, splinting, electrical stimulation, ultrasound, moist heat, cryotherapy, patient/family education, and DME and/or AE instructions  RECOMMENDED OTHER SERVICES: N/A  CONSULTED AND AGREED WITH PLAN OF  CARE: Patient  PLAN FOR NEXT SESSION: Manual Therapy, AA/ROM, A/ROM, Proximal shoulder strengthening   Trish Mage, OTR/L [facility] [phone] Kennyth Arnold, OT 01/29/2023, 8:51 AM

## 2023-02-01 ENCOUNTER — Encounter (HOSPITAL_COMMUNITY): Payer: Medicare HMO | Admitting: Occupational Therapy

## 2023-02-04 ENCOUNTER — Encounter (HOSPITAL_COMMUNITY): Payer: Medicare HMO | Admitting: Occupational Therapy

## 2023-10-11 IMAGING — DX DG CHEST 1V PORT
2 series · 2 of 2 positions shown · non-contrast
Comparison: Prior chest radiographs 09/23/2019 and earlier.

CLINICAL DATA: Provided history: Wheezing. Additional history
provided: Patient reports shortness of breath and back pain. History
of COPD, smoker.

EXAM:
PORTABLE CHEST 1 VIEW

[chest ap (1 of 2)]
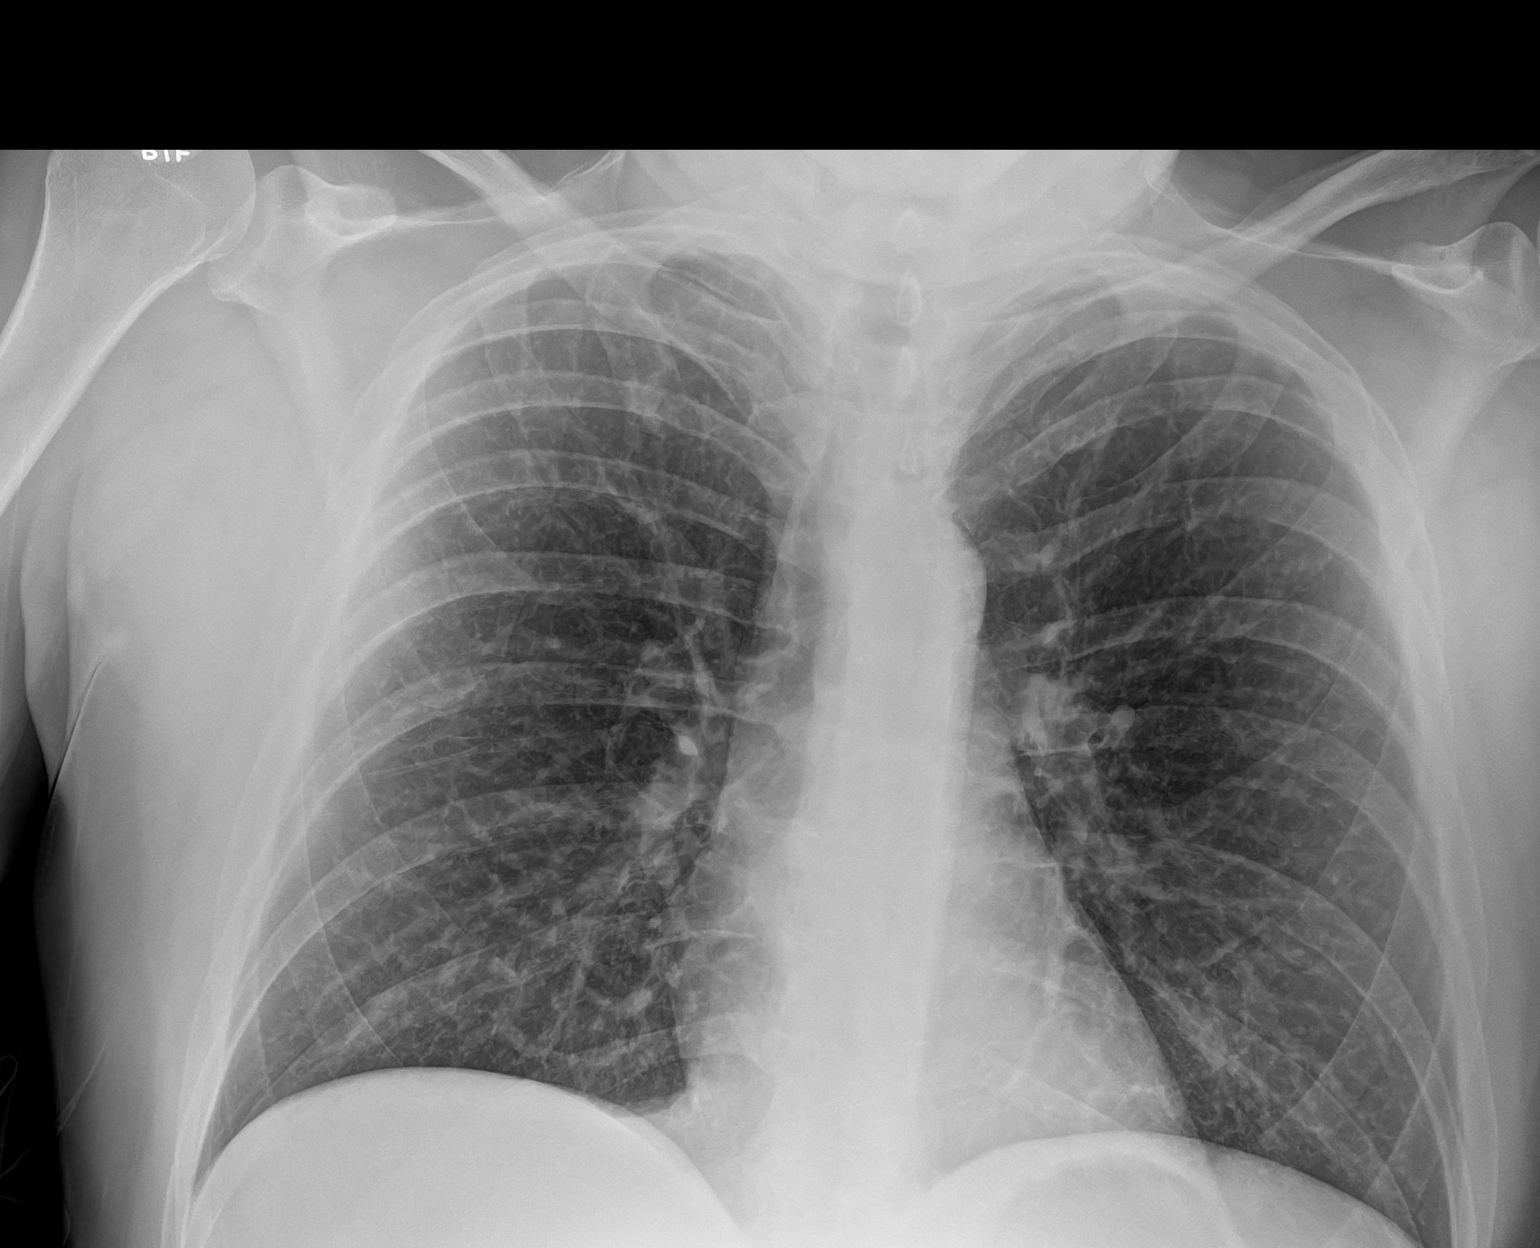

[chest ap (2 of 2)]
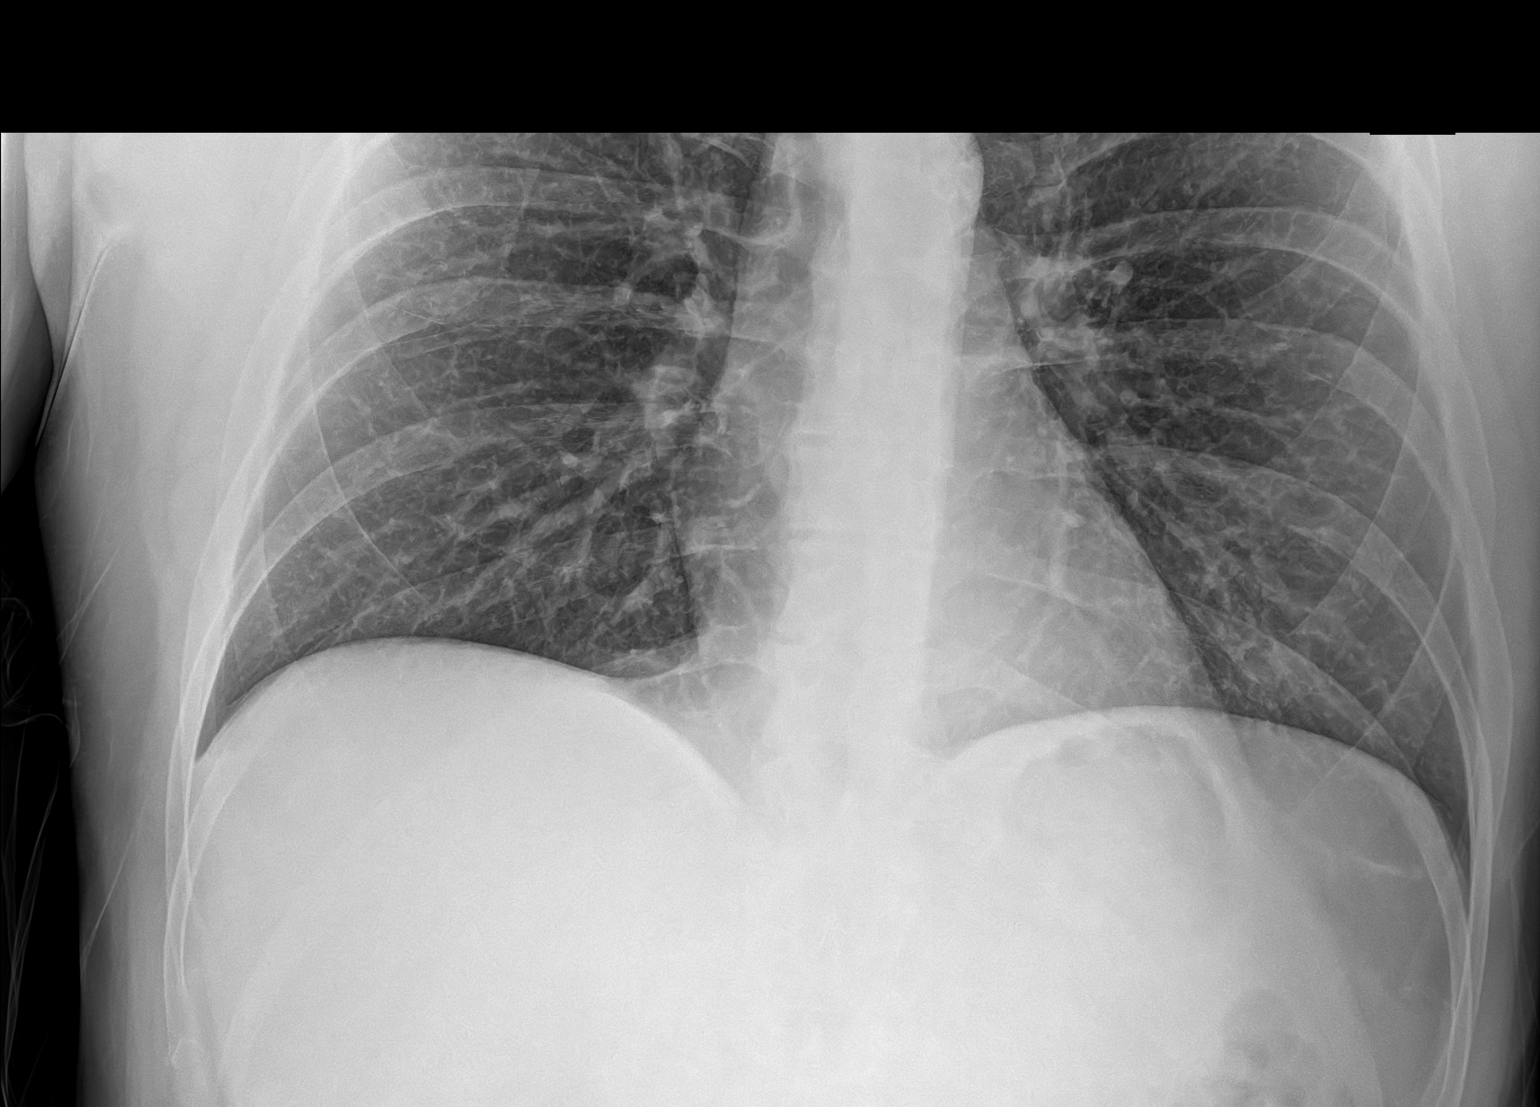

[2 of 2 positions shown; findings below may reference images not displayed]

FINDINGS: Heart size within normal limits. No appreciable airspace
consolidation. No evidence of pleural effusion or pneumothorax. No
acute bony abnormality identified.
IMPRESSION: No evidence of acute cardiopulmonary abnormality.

## 2023-12-18 DIAGNOSIS — R5383 Other fatigue: Secondary | ICD-10-CM | POA: Diagnosis not present

## 2023-12-18 DIAGNOSIS — Z299 Encounter for prophylactic measures, unspecified: Secondary | ICD-10-CM | POA: Diagnosis not present

## 2023-12-18 DIAGNOSIS — Z23 Encounter for immunization: Secondary | ICD-10-CM | POA: Diagnosis not present

## 2023-12-18 DIAGNOSIS — E7800 Pure hypercholesterolemia, unspecified: Secondary | ICD-10-CM | POA: Diagnosis not present

## 2023-12-18 DIAGNOSIS — Z79899 Other long term (current) drug therapy: Secondary | ICD-10-CM | POA: Diagnosis not present

## 2023-12-18 DIAGNOSIS — F17210 Nicotine dependence, cigarettes, uncomplicated: Secondary | ICD-10-CM | POA: Diagnosis not present

## 2023-12-18 DIAGNOSIS — Z1339 Encounter for screening examination for other mental health and behavioral disorders: Secondary | ICD-10-CM | POA: Diagnosis not present

## 2023-12-18 DIAGNOSIS — Z0000 Encounter for general adult medical examination without abnormal findings: Secondary | ICD-10-CM | POA: Diagnosis not present

## 2023-12-18 DIAGNOSIS — Z7189 Other specified counseling: Secondary | ICD-10-CM | POA: Diagnosis not present

## 2023-12-18 DIAGNOSIS — Z1331 Encounter for screening for depression: Secondary | ICD-10-CM | POA: Diagnosis not present

## 2023-12-31 DIAGNOSIS — E7800 Pure hypercholesterolemia, unspecified: Secondary | ICD-10-CM | POA: Diagnosis not present

## 2023-12-31 DIAGNOSIS — R0609 Other forms of dyspnea: Secondary | ICD-10-CM | POA: Diagnosis not present

## 2024-01-11 ENCOUNTER — Encounter (HOSPITAL_COMMUNITY): Payer: Self-pay | Admitting: Emergency Medicine

## 2024-01-11 ENCOUNTER — Other Ambulatory Visit: Payer: Self-pay

## 2024-01-11 ENCOUNTER — Emergency Department (HOSPITAL_COMMUNITY)

## 2024-01-11 ENCOUNTER — Emergency Department (HOSPITAL_COMMUNITY)
Admission: EM | Admit: 2024-01-11 | Discharge: 2024-01-11 | Disposition: A | Discharge status: Home / Self Care | Attending: Emergency Medicine | Admitting: Emergency Medicine

## 2024-01-11 DIAGNOSIS — R079 Chest pain, unspecified: Secondary | ICD-10-CM | POA: Diagnosis present

## 2024-01-11 DIAGNOSIS — F17210 Nicotine dependence, cigarettes, uncomplicated: Secondary | ICD-10-CM | POA: Insufficient documentation

## 2024-01-11 DIAGNOSIS — J441 Chronic obstructive pulmonary disease with (acute) exacerbation: Secondary | ICD-10-CM | POA: Diagnosis not present

## 2024-01-11 DIAGNOSIS — Z7951 Long term (current) use of inhaled steroids: Secondary | ICD-10-CM | POA: Insufficient documentation

## 2024-01-11 LAB — CBC
HCT: 44.7 % (ref 39.0–52.0)
Hemoglobin: 14.7 g/dL (ref 13.0–17.0)
MCH: 27 pg (ref 26.0–34.0)
MCHC: 32.9 g/dL (ref 30.0–36.0)
MCV: 82 fL (ref 80.0–100.0)
Platelets: 254 K/uL (ref 150–400)
RBC: 5.45 MIL/uL (ref 4.22–5.81)
RDW: 14.6 % (ref 11.5–15.5)
WBC: 8.6 K/uL (ref 4.0–10.5)
nRBC: 0 % (ref 0.0–0.2)

## 2024-01-11 LAB — BASIC METABOLIC PANEL WITH GFR
Anion gap: 11 (ref 5–15)
BUN: 12 mg/dL (ref 6–20)
CO2: 27 mmol/L (ref 22–32)
Calcium: 9.6 mg/dL (ref 8.9–10.3)
Chloride: 105 mmol/L (ref 98–111)
Creatinine, Ser: 0.88 mg/dL (ref 0.61–1.24)
GFR, Estimated: >60 mL/min (ref >60)
Glucose, Bld: 100 mg/dL — ABNORMAL HIGH (ref 70–99)
Potassium: 4.5 mmol/L (ref 3.5–5.1)
Sodium: 143 mmol/L (ref 135–145)

## 2024-01-11 LAB — TROPONIN T, HIGH SENSITIVITY: Troponin T High Sensitivity: <15 ng/L (ref 0–19)

## 2024-01-11 MED ORDER — IPRATROPIUM-ALBUTEROL 0.5-2.5 (3) MG/3ML IN SOLN
3.0000 mL | Freq: Once | RESPIRATORY_TRACT | Status: AC
  Administered 2024-01-11: 3 mL via RESPIRATORY_TRACT
  Filled 2024-01-11: qty 3

## 2024-01-11 MED ORDER — PREDNISONE 20 MG PO TABS: 40.0000 mg | ORAL_TABLET | Freq: Every day | ORAL | 0 refills | Status: AC

## 2024-01-11 MED ORDER — PREDNISONE 20 MG PO TABS
40.0000 mg | ORAL_TABLET | Freq: Once | ORAL | Status: AC
  Administered 2024-01-11: 40 mg via ORAL
  Filled 2024-01-11: qty 2

## 2024-01-11 NOTE — Discharge Instructions (Addendum)
 Pleasure taking care of you today.  You are seen for type tightness in your chest that were noted to be wheezing.  Chest x-ray did not show any signs of pneumonia or collapsed lung, EKG was reassuring, your cardiac enzyme testing (troponin) is normal as well.  I do feel that your symptoms are most likely related to a COPD exacerbation.  Continue using your albuterol  inhaler at home as needed and we are prescribing steroids.  Please avoid smoking.  Follow-up with your PCP on Monday or come back to the ER if you have any new or worsening symptoms.

## 2024-01-11 NOTE — ED Notes (Signed)
 Pt/family received d/c paperwork at this time. After going over the paperwork any questions, comments, or concerns were answered to the best of this nurse's knowledge. The pt/family verbally acknowledged the teachings/instructions.

## 2024-01-11 NOTE — ED Triage Notes (Signed)
 Pt c/o chest pain with deep breaths that started 2 days go. Pain goes across chest. Initially started when he was coughing. Non-productive cough present. Denies fevers. Pt reports history of collapsed lung. Kellogg RN

## 2024-01-11 NOTE — ED Provider Notes (Signed)
 [place] [facility] AT [facility] Provider Note   CSN: [id number] Arrival date & time: 01/11/24  1314     Patient presents with: Chest Pain (HX of pneumo)   Richard Hampton is a 48 y.o. male.  History of COPD, high cholesterol, low back pain, and remote history of a pneumothorax affecting the left side in 2008 requiring chest tube.  He states he has been having chest tightness and a burning pain with deep breaths and with cough for the past 2 days.  It is on both sides there is no radiation to the back, arms, neck or jaw.  He has no fever or chills, no nausea or vomiting, no lower extremity swelling or pain.  No hemoptysis or sputum production otherwise.  No history of cancer or history of VTE, he takes no hormonal medications.  He is not having exertional pain or worsening symptoms with exertion.  He does continue to smoke cigarettes daily.    Chest Pain      Prior to Admission medications   Medication Sig Start Date End Date Taking? Authorizing Provider  gabapentin  (NEURONTIN ) 100 MG capsule Take 1 capsule (100 mg total) by mouth 3 (three) times daily. 05/06/21 06/05/21  Cleotilde Rogue, MD  naproxen  (NAPROSYN ) 375 MG tablet Take 1 tablet (375 mg total) by mouth 2 (two) times daily. 10/09/22   Kommor, [place], MD  predniSONE  (DELTASONE ) 20 MG tablet Take 2 tablets (40 mg total) by mouth daily. 01/11/24   Suellen Cantor A, PA-C    Allergies: Penicillins    Review of Systems  Cardiovascular:  Positive for chest pain.    Updated Vital Signs BP (!) 142/85 (BP Location: Right Arm)   Pulse 81   Temp 98 F (36.7 C) (Oral)   Resp 20   Ht 6' 3 (1.905 m)   Wt 111.1 kg   SpO2 99%   BMI 30.62 kg/m   Physical Exam Vitals and nursing note reviewed.  Constitutional:      General: He is not in acute distress.    Appearance: He is well-developed.  HENT:     Head: Normocephalic and atraumatic.  Eyes:     Conjunctiva/sclera: Conjunctivae normal.   Cardiovascular:     Rate and Rhythm: Normal rate and regular rhythm.     Heart sounds: No murmur heard. Pulmonary:     Effort: Pulmonary effort is normal. No respiratory distress.     Breath sounds: Examination of the right-middle field reveals wheezing. Examination of the left-middle field reveals wheezing. Examination of the right-lower field reveals wheezing. Examination of the left-lower field reveals wheezing. Wheezing present. No decreased breath sounds.  Abdominal:     Palpations: Abdomen is soft.     Tenderness: There is no abdominal tenderness.  Musculoskeletal:        General: No swelling. Normal range of motion.     Cervical back: Neck supple.     Right lower leg: No tenderness. No edema.     Left lower leg: No tenderness. No edema.  Skin:    General: Skin is warm and dry.     Capillary Refill: Capillary refill takes less than 2 seconds.  Neurological:     General: No focal deficit present.     Mental Status: He is alert and oriented to person, place, and time.  Psychiatric:        Mood and Affect: Mood normal.     (all labs ordered are listed, but only abnormal results are  displayed) Labs Reviewed  BASIC METABOLIC PANEL WITH GFR - Abnormal; Notable for the following components:      Result Value   Glucose, Bld 100 (*)    All other components within normal limits  CBC  TROPONIN T, HIGH SENSITIVITY    EKG: None  Radiology: DG Chest 2 View Result Date: 01/11/2024 CLINICAL DATA:  Two day history of pleuritic chest pain EXAM: CHEST - 2 VIEW COMPARISON:  Chest radiograph dated 05/19/2022 FINDINGS: Normal lung volumes. No focal consolidations. No pleural effusion or pneumothorax. The heart size and mediastinal contours are within normal limits. No acute osseous abnormality. IMPRESSION: No active cardiopulmonary disease. Electronically Signed   By: Limin  Xu M.D.   On: 01/11/2024 14:10     Procedures   Medications Ordered in the ED  predniSONE  (DELTASONE ) tablet  40 mg (40 mg Oral Given 01/11/24 1515)  ipratropium-albuterol  (DUONEB) 0.5-2.5 (3) MG/3ML nebulizer solution 3 mL (3 mLs Nebulization Given 01/11/24 1502)  ipratropium-albuterol  (DUONEB) 0.5-2.5 (3) MG/3ML nebulizer solution 3 mL (3 mLs Nebulization Given 01/11/24 1641)                                    Medical Decision Making Differential diagnosis includes but is not limited to COPD exacerbation, pneumothorax, pneumonia, ACS, PE, other  Labs: Troponin negative, BMP and CBC normal  Imaging: Chest x-ray shows no pulmonary edema or infiltrate or pneumothorax per my interpretation, agree with radiology read.  ED course: Patient is here for tightness across his chest the past several days she has had a dry cough.  He does have history of COPD and continues to smoke cigarettes daily.  He states he has a daily controller inhaler that he takes in the morning and he also has an albuterol  rescue inhaler and but is only getting partial relief with the albuterol  inhaler.  He is low risk Wells score and negative on all PERC criteria On exam he does have wheezing, after 1 breathing treatment his symptoms were partially improved but wheezing was slightly worsened, after second treatment wheezing is significantly improved, patient is feeling better requesting to leave.  Oxygen  saturation has been normal.  Will discharge patient with prednisone , he states he has an adequate supply at home of his rescue inhaler and does not feel he needs any further prescription for this.  He is going to follow-up with his PCP on Monday who manages his COPD.  He was given strict return precautions.  Amount and/or Complexity of Data Reviewed Labs: ordered. Radiology: ordered.  Risk Prescription drug management.        Final diagnoses:  COPD exacerbation [facility])    ED Discharge Orders          Ordered    predniSONE  (DELTASONE ) 20 MG tablet  Daily        01/11/24 [address],  PA-C 01/11/24 1656    Cleotilde Rogue, MD 01/12/24 1821

## 2024-01-16 DIAGNOSIS — R52 Pain, unspecified: Secondary | ICD-10-CM | POA: Diagnosis not present

## 2024-01-16 DIAGNOSIS — Z299 Encounter for prophylactic measures, unspecified: Secondary | ICD-10-CM | POA: Diagnosis not present

## 2024-01-16 DIAGNOSIS — M5440 Lumbago with sciatica, unspecified side: Secondary | ICD-10-CM | POA: Diagnosis not present

## 2024-01-16 DIAGNOSIS — R053 Chronic cough: Secondary | ICD-10-CM | POA: Diagnosis not present

## 2024-01-16 DIAGNOSIS — J449 Chronic obstructive pulmonary disease, unspecified: Secondary | ICD-10-CM | POA: Diagnosis not present

## 2024-01-23 DIAGNOSIS — F17210 Nicotine dependence, cigarettes, uncomplicated: Secondary | ICD-10-CM | POA: Diagnosis not present

## 2024-01-23 DIAGNOSIS — J449 Chronic obstructive pulmonary disease, unspecified: Secondary | ICD-10-CM | POA: Diagnosis not present

## 2024-01-23 DIAGNOSIS — K219 Gastro-esophageal reflux disease without esophagitis: Secondary | ICD-10-CM | POA: Diagnosis not present

## 2024-01-23 DIAGNOSIS — Z7951 Long term (current) use of inhaled steroids: Secondary | ICD-10-CM | POA: Diagnosis not present

## 2024-01-23 DIAGNOSIS — E785 Hyperlipidemia, unspecified: Secondary | ICD-10-CM | POA: Diagnosis not present

## 2024-01-23 DIAGNOSIS — F1021 Alcohol dependence, in remission: Secondary | ICD-10-CM | POA: Diagnosis not present

## 2024-01-23 DIAGNOSIS — Z6830 Body mass index (BMI) 30.0-30.9, adult: Secondary | ICD-10-CM | POA: Diagnosis not present

## 2024-01-23 DIAGNOSIS — E669 Obesity, unspecified: Secondary | ICD-10-CM | POA: Diagnosis not present

## 2024-01-23 DIAGNOSIS — R030 Elevated blood-pressure reading, without diagnosis of hypertension: Secondary | ICD-10-CM | POA: Diagnosis not present
